# Patient Record
Sex: Female | Born: 1950 | Race: White | Hispanic: No | Smoking: Former smoker
Health system: Southern US, Community
[De-identification: ages and names within clinical notes are randomized; demographics above are authoritative.]

## PROBLEM LIST (undated history)

## (undated) DIAGNOSIS — O00109 Unspecified tubal pregnancy without intrauterine pregnancy: Secondary | ICD-10-CM

## (undated) DIAGNOSIS — F17200 Nicotine dependence, unspecified, uncomplicated: Secondary | ICD-10-CM

## (undated) DIAGNOSIS — K649 Unspecified hemorrhoids: Secondary | ICD-10-CM

## (undated) HISTORY — DX: Unspecified hemorrhoids: K64.9

## (undated) HISTORY — DX: Unspecified tubal pregnancy without intrauterine pregnancy: O00.109

## (undated) HISTORY — DX: Nicotine dependence, unspecified, uncomplicated: F17.200

## (undated) HISTORY — PX: TONSILLECTOMY: SUR1361

---

## 1975-04-09 DIAGNOSIS — O00109 Unspecified tubal pregnancy without intrauterine pregnancy: Secondary | ICD-10-CM

## 1975-04-09 HISTORY — DX: Unspecified tubal pregnancy without intrauterine pregnancy: O00.109

## 1975-04-09 HISTORY — PX: APPENDECTOMY: SHX54

## 1975-04-09 HISTORY — PX: ABDOMINAL HYSTERECTOMY: SHX81

## 2005-02-20 DIAGNOSIS — F17200 Nicotine dependence, unspecified, uncomplicated: Secondary | ICD-10-CM

## 2005-02-20 HISTORY — DX: Nicotine dependence, unspecified, uncomplicated: F17.200

## 2008-02-21 DIAGNOSIS — K219 Gastro-esophageal reflux disease without esophagitis: Secondary | ICD-10-CM | POA: Insufficient documentation

## 2009-05-02 ENCOUNTER — Ambulatory Visit: Payer: Self-pay | Admitting: Family Medicine

## 2009-05-10 LAB — HM DEXA SCAN: HM Dexa Scan: NORMAL

## 2014-07-11 LAB — LIPID PANEL
CHOLESTEROL: 273 mg/dL — AB (ref 0–200)
HDL: 46 mg/dL (ref 35–70)
LDL Cholesterol: 152 mg/dL
LDl/HDL Ratio: 3.3
TRIGLYCERIDES: 373 mg/dL — AB (ref 40–160)

## 2014-07-11 LAB — HEPATIC FUNCTION PANEL
ALT: 28 U/L (ref 7–35)
AST: 21 U/L (ref 13–35)
Alkaline Phosphatase: 65 U/L (ref 25–125)
Bilirubin, Total: 0.2 mg/dL

## 2014-07-11 LAB — CBC AND DIFFERENTIAL
HEMATOCRIT: 41 % (ref 36–46)
HEMOGLOBIN: 13.7 g/dL (ref 12.0–16.0)
NEUTROS ABS: 5 /uL
Platelets: 398 10*3/uL (ref 150–399)
WBC: 10.6 10^3/mL

## 2014-07-11 LAB — BASIC METABOLIC PANEL
BUN: 10 mg/dL (ref 4–21)
Creatinine: 1 mg/dL (ref 0.5–1.1)
Glucose: 97 mg/dL
POTASSIUM: 4.4 mmol/L (ref 3.4–5.3)
SODIUM: 140 mmol/L (ref 137–147)

## 2014-07-11 LAB — TSH: TSH: 1.63 u[IU]/mL (ref 0.41–5.90)

## 2014-07-13 DIAGNOSIS — N6310 Unspecified lump in the right breast, unspecified quadrant: Secondary | ICD-10-CM | POA: Insufficient documentation

## 2014-08-11 ENCOUNTER — Encounter: Payer: Self-pay | Admitting: General Surgery

## 2014-08-11 ENCOUNTER — Ambulatory Visit: Payer: BLUE CROSS/BLUE SHIELD

## 2014-08-11 ENCOUNTER — Ambulatory Visit (INDEPENDENT_AMBULATORY_CARE_PROVIDER_SITE_OTHER): Payer: BLUE CROSS/BLUE SHIELD | Admitting: General Surgery

## 2014-08-11 VITALS — BP 138/78 | HR 78 | Resp 13 | Ht 69.0 in | Wt 195.0 lb

## 2014-08-11 DIAGNOSIS — R928 Other abnormal and inconclusive findings on diagnostic imaging of breast: Secondary | ICD-10-CM | POA: Diagnosis not present

## 2014-08-11 DIAGNOSIS — N6310 Unspecified lump in the right breast, unspecified quadrant: Secondary | ICD-10-CM

## 2014-08-11 DIAGNOSIS — N63 Unspecified lump in breast: Secondary | ICD-10-CM | POA: Diagnosis not present

## 2014-08-11 NOTE — Progress Notes (Signed)
Patient ID: Alexis Hart, female   DOB: [birth year], 64 y.o.   MRN: [id number]  Chief Complaint  Patient presents with  . Breast Problem    abnormal mammogram    HPI Alexis Hart is a 64 y.o. female who presents for a breast evaluation. The most recent mammogram was done on 07-20-14 with added views on 08-02-14. Patient does perform regular self breast checks and gets regular mammograms done. She denies any problems with the breasts.  There is no history of trauma.  HPI  Past Medical History  Diagnosis Date  . Hyperlipidemia   . Hemorrhoids   . Tubal pregnancy 1977    Past Surgical History  Procedure Laterality Date  . Appendectomy  1977  . Abdominal hysterectomy  1977    Total    Family History  Problem Relation Age of Onset  . Ovarian cancer Mother   . Melanoma Father     Social History History  Substance Use Topics  . Smoking status: Current Every Day Smoker -- 1.25 packs/day for 40 years    Types: Cigarettes  . Smokeless tobacco: Never Used  . Alcohol Use: No    Allergies  Allergen Reactions  . Latex Rash    Current Outpatient Prescriptions  Medication Sig Dispense Refill  . nicotine polacrilex (NICORETTE) 4 MG gum Take 4 mg by mouth as needed for smoking cessation.    . pravastatin (PRAVACHOL) 20 MG tablet daily.  11  . Vitamin D, Ergocalciferol, (DRISDOL) 50000 UNITS CAPS capsule once a week.  2   No current facility-administered medications for this visit.    Review of Systems Review of Systems  Constitutional: Negative.   Respiratory: Negative.   Cardiovascular: Negative.     Blood pressure 138/78, pulse 78, resp. rate 13, height 5\' 9"  (1.753 m), weight 195 lb (88.451 kg).  Physical Exam Physical Exam  Constitutional: She is oriented to person, place, and time. She appears well-developed and well-nourished.  Eyes: Conjunctivae are normal. No scleral icterus.  Neck: Neck supple.  Cardiovascular: Normal rate, regular rhythm and normal  heart sounds.   Pulmonary/Chest: Effort normal and breath sounds normal. Right breast exhibits no inverted nipple, no mass, no nipple discharge, no skin change and no tenderness. Left breast exhibits no inverted nipple, no mass, no nipple discharge, no skin change and no tenderness. Breasts are asymmetrical (left breast 1/2 cup size larger).  Right breast little thickening in the upper outer quadrant  Lymphadenopathy:    She has no cervical adenopathy.    She has no axillary adenopathy.  Neurological: She is alert and oriented to person, place, and time.    Data Reviewed  PCP notes of 07/11/2014 were reviewed.  Laboratory studies associated with that visit showed a hemoglobin of 13.7 with an MCV of 85, white blood cell count of 10,600. Normal conference metabolic panel. Creatinine 0.9 with an estimated GFR of 63. Normal liver function studies.  Screening mammogram dated 07/20/2014 suggested an asymmetry/mass in the lateral aspect of the right breast for which additional views were requested. BI-RADS-0.  Focal spot compression views and ultrasound dated 08/02/2014 suggested a 1.1 cm oval mass in the right lateral breast 10 cm from the nipple. Ultrasound examination showed the area to be irregular and complex in appearance. Additional imaging post ultrasound suggested the ultrasound lesion in the mammographic abnormalities were one in the same. BI-RADS-4.  Ultrasound examination of the right breast showed multiple small cystic lesions in the retroareolar area. In the 8:00 position  of the breast 7 cm from the nipple an irregular multilobulated mass measuring 0.6 x 0.8 x 1.33 cm was noted. This corresponded to the imaging studies previously completed at [facility].  The patient was amenable to vacuum assisted biopsy. 10 mL of 0.5% Xylocaine with 0.25% Marcaine with 1-200,000 of epinephrine was utilized well tolerated. Under ultrasound guidance the 10-gauge Encor biopsy device was advanced into  the area and 7 core samples obtained with complete removal. A postbiopsy clip was placed. Of note there was a significant reduction in the volume of the area with initial passage of the cord biopsy needle.  The skin defect was closed with benzoin and Steri-Strips followed by Telfa and Tegaderm dressing.  Assessment    Abnormal right breast mammogram, likely complex cystic lesion rather than malignancy.    Plan    The patient tolerated the procedure well. Postbiopsy mammogram will be obtained tomorrow to confirm the area of mammographic concern has been sampled. Further follow-up will take place based on the pathology results.  Patient to have a post biopsy mammogram of the right breast at [facility] tomorrow, 08-12-14 at 11:50 am.   This patient will return to the office in one week for a nurse visit follow up.      PCP:  Erenest Rasher 08/12/2014, 12:24 PM

## 2014-08-11 NOTE — Patient Instructions (Addendum)
CARE AFTER BREAST BIOPSY  1. Leave the dressing on that your doctor applied after surgery. It is waterproof. You may bathe, shower and/or swim. The dressing will probably remain intact until your return office visit. If the dressing comes off, you will see small strips of tape against your skin on the incision. Do not remove these strips.  2. You may want to use a gauze,cloth or similar protection in your bra to prevent rubbing against your dressing and incision. This is not necessary, but you may feel more comfortable doing so.  3. It is recommended that you wear a bra day and night to give support to the breast. This will prevent the weight of the breast from pulling on the incision.  4. Your breast will feel hard and lumpy under the incision. Do not be alarmed. This is the underlying stitching of tissue. Softening of this tissue will occur in time.  5. Make sure you call the office and schedule an appointment in one week after your surgery. The office phone number is [phone]. The [facility] may have already done this for you.  6. You will notice about a week after your office visit that the strips of the tape on your incision will begin to loosen. These may then be removed.  7. Report to your doctor any of the following:  * Severe pain not relieved by your pain medication  *Redness of the incision  * Drainage from the incision  *Fever greater than 101 degrees  Patient to have a post biopsy mammogram of the right breast at [facility] tomorrow, 08-12-14 at 11:50 am.   This patient will return to the office in one week for a nurse visit follow up.

## 2014-08-12 DIAGNOSIS — R928 Other abnormal and inconclusive findings on diagnostic imaging of breast: Secondary | ICD-10-CM | POA: Insufficient documentation

## 2014-08-15 ENCOUNTER — Telehealth: Payer: Self-pay | Admitting: General Surgery

## 2014-08-15 NOTE — Telephone Encounter (Signed)
The patient was notified that the recently completed vacuum biopsy of the right breast was benign. This comes along with the marked reduction in the lesion size on passage of the 10-gauge needle through the area of concern.  We'll plan for nursing follow-up at the end of the week and repeat right breast mammogram and office ultrasound in 6 months.

## 2014-08-18 ENCOUNTER — Ambulatory Visit (INDEPENDENT_AMBULATORY_CARE_PROVIDER_SITE_OTHER): Payer: BLUE CROSS/BLUE SHIELD | Admitting: *Deleted

## 2014-08-18 DIAGNOSIS — N63 Unspecified lump in breast: Secondary | ICD-10-CM

## 2014-08-18 DIAGNOSIS — N6310 Unspecified lump in the right breast, unspecified quadrant: Secondary | ICD-10-CM

## 2014-08-18 NOTE — Progress Notes (Signed)
Patient here today for follow up post right breast biopsy. Minimal bruising noted.  The patient is aware that a heating pad may be used for comfort as needed.  Aware of pathology. Follow up as scheduled. 

## 2015-01-07 ENCOUNTER — Other Ambulatory Visit: Payer: Self-pay | Admitting: Family Medicine

## 2015-02-22 ENCOUNTER — Ambulatory Visit: Payer: BLUE CROSS/BLUE SHIELD | Admitting: General Surgery

## 2015-06-07 DIAGNOSIS — E559 Vitamin D deficiency, unspecified: Secondary | ICD-10-CM | POA: Insufficient documentation

## 2015-06-07 DIAGNOSIS — R413 Other amnesia: Secondary | ICD-10-CM | POA: Insufficient documentation

## 2015-06-07 DIAGNOSIS — Z8669 Personal history of other diseases of the nervous system and sense organs: Secondary | ICD-10-CM | POA: Insufficient documentation

## 2015-06-07 DIAGNOSIS — R030 Elevated blood-pressure reading, without diagnosis of hypertension: Secondary | ICD-10-CM | POA: Insufficient documentation

## 2015-07-13 ENCOUNTER — Encounter: Payer: Self-pay | Admitting: Family Medicine

## 2015-07-13 ENCOUNTER — Telehealth: Payer: Self-pay | Admitting: Family Medicine

## 2015-07-13 ENCOUNTER — Ambulatory Visit (INDEPENDENT_AMBULATORY_CARE_PROVIDER_SITE_OTHER): Payer: BLUE CROSS/BLUE SHIELD | Admitting: Family Medicine

## 2015-07-13 VITALS — BP 130/78 | HR 66 | Temp 98.0°F | Resp 16 | Ht 69.0 in | Wt 198.0 lb

## 2015-07-13 DIAGNOSIS — Z1211 Encounter for screening for malignant neoplasm of colon: Secondary | ICD-10-CM

## 2015-07-13 DIAGNOSIS — S82409A Unspecified fracture of shaft of unspecified fibula, initial encounter for closed fracture: Secondary | ICD-10-CM | POA: Insufficient documentation

## 2015-07-13 DIAGNOSIS — E785 Hyperlipidemia, unspecified: Secondary | ICD-10-CM

## 2015-07-13 DIAGNOSIS — S8261XA Displaced fracture of lateral malleolus of right fibula, initial encounter for closed fracture: Secondary | ICD-10-CM

## 2015-07-13 DIAGNOSIS — R030 Elevated blood-pressure reading, without diagnosis of hypertension: Secondary | ICD-10-CM

## 2015-07-13 DIAGNOSIS — E2839 Other primary ovarian failure: Secondary | ICD-10-CM

## 2015-07-13 DIAGNOSIS — N63 Unspecified lump in breast: Secondary | ICD-10-CM

## 2015-07-13 DIAGNOSIS — E559 Vitamin D deficiency, unspecified: Secondary | ICD-10-CM | POA: Diagnosis not present

## 2015-07-13 DIAGNOSIS — Z0000 Encounter for general adult medical examination without abnormal findings: Secondary | ICD-10-CM

## 2015-07-13 DIAGNOSIS — F410 Panic disorder [episodic paroxysmal anxiety] without agoraphobia: Secondary | ICD-10-CM | POA: Diagnosis not present

## 2015-07-13 DIAGNOSIS — N6310 Unspecified lump in the right breast, unspecified quadrant: Secondary | ICD-10-CM

## 2015-07-13 DIAGNOSIS — Z0001 Encounter for general adult medical examination with abnormal findings: Secondary | ICD-10-CM

## 2015-07-13 DIAGNOSIS — Z23 Encounter for immunization: Secondary | ICD-10-CM | POA: Diagnosis not present

## 2015-07-13 DIAGNOSIS — M8580 Other specified disorders of bone density and structure, unspecified site: Secondary | ICD-10-CM | POA: Diagnosis not present

## 2015-07-13 DIAGNOSIS — M545 Low back pain: Secondary | ICD-10-CM

## 2015-07-13 LAB — POCT URINALYSIS DIPSTICK
BILIRUBIN UA: NEGATIVE
GLUCOSE UA: NEGATIVE
Ketones, UA: NEGATIVE
Leukocytes, UA: NEGATIVE
NITRITE UA: NEGATIVE
Protein, UA: NEGATIVE
Spec Grav, UA: 1.01
Urobilinogen, UA: 0.2
pH, UA: 5

## 2015-07-13 NOTE — Patient Instructions (Signed)
   Please contact your eyecare professional to schedule a routine eye exam  

## 2015-07-13 NOTE — Telephone Encounter (Signed)
FYI--Pt refused bone density at this time.I advised pt that I could schedule it several months out if she would like.She states she will call back when ready to schedule

## 2015-07-13 NOTE — Telephone Encounter (Signed)
Fwd to Dr. Caryn Section: Juluis Rainier

## 2015-07-13 NOTE — Progress Notes (Signed)
Patient: Alexis Hart, Female    DOB: 11/29/1950, 65 y.o.   MRN: [id number] Visit Date: 07/13/2015  Today's Provider: Lelon Huh, MD   Chief Complaint  Patient presents with  . Medicare Wellness  . Follow-up    Vitamin D deficiency  . Hyperlipidemia    follow up  . Leg Injury   Subjective:    Annual wellness visit Alexis Hart is a 65 y.o. female. She feels fairly well. She reports never exercising. She reports she is sleeping well.  -----------------------------------------------------------  Lipid/Cholesterol, Follow-up:   Last seen for this1 years ago.  Management changes since that visit include advising patient to cut back on sweets and saturated fats. Patient was to continue taking Pravastatin, but has since stopped since did feel well when taking it. She states her energy has since improved, her hair in no longer dry and brittle, and no longer has dizzy spells since stopping statin.  . Last Lipid Panel:    Component Value Date/Time   CHOL 273* 07/11/2014   TRIG 373* 07/11/2014   HDL 46 07/11/2014   LDLCALC 152 07/11/2014    Risk factors for vascular disease include hypercholesterolemia  She reports poor compliance with treatment. Patient stopped taking Pravastatin 2 months ago due to patient believing that it was causing hair loss, fatigue, brittle nails and sensitivity to light. Patient states symptoms have all resolved since she stopped the Pravastatin. Patient has been cutting back on sweets and saturated fats.  She is having side effects.  Current symptoms include none and have been stable. Weight trend: fluctuating a bit Prior visit with dietician: no Current diet: in general, a "healthy" diet   Current exercise: none  Wt Readings from Last 3 Encounters:  07/11/14 193 lb (87.544 kg)  08/11/14 195 lb (88.451 kg)    ------------------------------------------------------------------- Follow up Vitamin D Deficiency: Last office visit  was 1 year ago and no changes were made. Patient was advised to continue weekly vitamin D supplements. Patient stopped taking Vitamin D supplements 2 months ago.  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 Patient was seen at [facility]. X rays were obtained showing an avulsion fracture of distal fibula Patient has swelling, pain and bruising of the right lower leg and foot. She is unable to drive because she is unable to apply pressure to break pedal. She is able to bear weight and walk, but is very painful. Pain and swelling have improved slightly over the last few days. She has been wearing any OTC ankle brace.   Mass right breast She was evaluated by Dr. Bary Castilla and had biopsy of mass of right breast about a year ago which was apparently benign. She advised to follow up with Dr. Bary Castilla after 6 months for in-office ultrasound, but she has not returned for follow up. She has not notived any new lesions or masses.   Review of Systems  Constitutional: Positive for fatigue. Negative for fever and chills.  HENT: Negative for congestion, ear pain, rhinorrhea, sneezing and sore throat.   Eyes: Negative.  Negative for pain and redness.  Respiratory: Positive for shortness of breath. Negative for cough and wheezing.   Cardiovascular: Positive for leg swelling. Negative for chest pain.  Gastrointestinal: Positive for diarrhea. Negative for nausea, abdominal pain, constipation and blood in stool.  Endocrine: Negative  for polydipsia and polyphagia.  Genitourinary: Negative.  Negative for dysuria, hematuria, flank pain, vaginal bleeding, vaginal discharge and pelvic pain.  Musculoskeletal: Positive for arthralgias. Negative for back pain, joint swelling and gait problem.  Skin: Negative for rash.  Neurological: Positive for dizziness and light-headedness. Negative for tremors, seizures,  weakness, numbness and headaches.  Hematological: Negative for adenopathy.  Psychiatric/Behavioral: Negative.  Negative for behavioral problems, confusion and dysphoric mood. The patient is not nervous/anxious and is not hyperactive.     Social History   Social History  . Marital Status: Divorced    Spouse Name: N/A  . Number of Children: N/A  . Years of Education: N/A   Occupational History  . Not on file.   Social History Main Topics  . Smoking status: Current Every Day Smoker -- 1.25 packs/day for 40 years    Types: Cigarettes  . Smokeless tobacco: Never Used  . Alcohol Use: No  . Drug Use: No  . Sexual Activity: Not on file   Other Topics Concern  . Not on file   Social History Narrative    Past Medical History  Diagnosis Date  . Hyperlipidemia   . Hemorrhoids   . Tubal pregnancy 1977     Patient Active Problem List   Diagnosis Date Noted  . Blood pressure elevated without history of HTN 06/07/2015  . H/O disease 06/07/2015  . Memory loss 06/07/2015  . Avitaminosis D 06/07/2015  . Abnormal mammogram of right breast 08/12/2014  . Osteopenia 05/10/2009  . Acid reflux 02/21/2008  . Episodic paroxysmal anxiety disorder 02/18/2007  . Compulsive tobacco user syndrome 02/20/2005  . Combined fat and carbohydrate induced hyperlipemia 04/08/2002    Past Surgical History  Procedure Laterality Date  . Appendectomy  1977  . Abdominal hysterectomy  1977    Total-due to ectopic pregnancy    Her family history includes Diabetes in her father; Heart attack in her paternal grandmother; Heart disease in her father; Lupus in her paternal grandfather and sister; Melanoma in her father; Ovarian cancer in her mother.    Previous Medications   NICOTINE POLACRILEX (NICORETTE) 4 MG GUM    Take 4 mg by mouth as needed for smoking cessation.   PRAVASTATIN (PRAVACHOL) 20 MG TABLET    daily.   VITAMIN D, ERGOCALCIFEROL, (DRISDOL) 50000 UNITS CAPS CAPSULE    TAKE 1 CAPSULE BY  MOUTH EVERY WEEK FOR 12 WEEKS.    Patient Care Team: Birdie Sons, MD as PCP - General (Family Medicine) Robert Bellow, MD as Consulting Physician (General Surgery) Birdie Sons, MD as Referring Physician (Family Medicine)     Objective:   Vitals: BP 130/78 mmHg  Pulse 66  Temp(Src) 98 F (36.7 C) (Oral)  Resp 16  Ht 5\' 9"  (1.753 m)  Wt 198 lb (89.812 kg)  BMI 29.23 kg/m2  SpO2 97%  Physical Exam   General Appearance:    Alert, cooperative, no distress, appears stated age  Head:    Normocephalic, without obvious abnormality, atraumatic  Eyes:    PERRL, conjunctiva/corneas clear, EOM's intact, fundi    benign, both eyes  Ears:    Normal TM's and external ear canals, both ears  Nose:   Nares normal, septum midline, mucosa normal, no drainage    or sinus tenderness  Throat:   Lips, mucosa, and tongue normal; teeth and gums normal  Neck:   Supple, symmetrical, trachea midline, no adenopathy;    thyroid:  no enlargement/tenderness/nodules; no carotid  bruit or JVD  Back:     Symmetric, no curvature, ROM normal, no CVA tenderness  Lungs:     Clear to auscultation bilaterally, respirations unlabored  Chest Wall:    No tenderness or deformity   Heart:    Regular rate and rhythm, S1 and S2 normal, no murmur, rub   or gallop  Breast Exam:    deferred  Abdomen:     Soft, non-tender, bowel sounds active all four quadrants,    no masses, no organomegaly  Pelvic:    deferred  Extremities:   Moderately swollen and tender right ankle. Bears weight just enough to ambulate, but with significant pain.   Pulses:   2+ and symmetric all extremities  Skin:   Skin color, texture, turgor normal, no rashes or lesions  Lymph nodes:   Cervical, supraclavicular, and axillary nodes normal  Neurologic:   CNII-XII intact, normal strength, sensation and reflexes    throughout     Activities of Daily Living In your present state of health, do you have any difficulty performing the  following activities: 07/13/2015  Hearing? N  Vision? N  Difficulty concentrating or making decisions? N  Walking or climbing stairs? Y  Dressing or bathing? Y  Doing errands, shopping? Y    Fall Risk Assessment Fall Risk  07/13/2015  Falls in the past year? Yes  Number falls in past yr: 1  Injury with Fall? Yes  Follow up Falls prevention discussed     Depression Screen PHQ 2/9 Scores 07/13/2015  PHQ - 2 Score 0   Current Exercise Habits: The patient does not participate in regular exercise at present Exercise limited by: Other - see comments (recent fracture)  Cognitive Testing - 6-CIT  Correct? Score   What year is it? yes 0 0 or 4  What month is it? yes 0 0 or 3  Memorize:    Pia Mau,  40,  [place],      What time is it? (within 1 hour) yes 0 0 or 3  Count backwards from 20 yes 0 0, 2, or 4  Name the months of the year no 2 0, 2, or 4  Repeat name & address above no 10 0, 2, 4, 6, 8, or 10       TOTAL SCORE  12/28   Interpretation:  Abnormal- .  Normal (0-7) Abnormal (8-28)    Audit-C Alcohol Use Screening  Question Answer Points  How often do you have alcoholic drink? never 0  On days you do drink alcohol, how many drinks do you typically consume? 0 0  How oftey will you drink 6 or more in a total? never 0  Total Score:  0   A score of 3 or more in women, and 4 or more in men indicates increased risk for alcohol abuse, EXCEPT if all of the points are from question 1.    Assessment & Plan:     Annual Wellness Visit  Reviewed patient's Family Medical History Reviewed and updated list of patient's medical providers Assessment of cognitive impairment was done Assessed patient's functional ability Established a written schedule for health screening Gervais Completed and Reviewed  Exercise Activities and Dietary recommendations Goals    None      Immunization History  Administered Date(s) Administered  .  Influenza-Unspecified 01/24/2015  . Pneumococcal Conjugate-13 06/28/2013  . Tdap 05/02/2009  . Zoster 05/02/2009    Health Maintenance  Topic Date  Due  . Hepatitis C Screening  11-15-1950  . COLONOSCOPY  06/27/1998  . PNA vac Low Risk Adult (2 of 2 - PPSV23) 06/29/2014  . INFLUENZA VACCINE  11/07/2015  . MAMMOGRAM  08/10/2016  . TETANUS/TDAP  05/03/2019  . DEXA SCAN  Completed  . ZOSTAVAX  Completed      Discussed health benefits of physical activity, and encouraged her to engage in regular exercise appropriate for her age and condition.    ------------------------------------------------------------------------------------------------------------  1. Medicare annual wellness visit, subsequent   2. Hyperlipidemia Intolerant to multiple statins - Lipid panel - Hepatic function panel  3. Osteopenia   4. Vitamin D deficiency Feels better on vitamin D - VITAMIN D 25 Hydroxy (Vit-D Deficiency, Fractures)  5. Panic disorder   6. Mass of breast, right Overdue for follow up with Dr. Bary Castilla - Ambulatory referral to General Surgery  7. Blood pressure elevated without history of HTN Better today - Renal function panel  8. Estrogen deficiency  - DG Bone Density; Future  9. Avulsion fracture right, closed, initial encounter  - DG Bone Density; Future - Refer orthopedics. Is not to drive until cleared by orthopedics. Work excuse provided today.   10. Need for pneumococcal vaccination  - Pneumococcal polysaccharide vaccine 23-valent greater than or equal to 2yo subcutaneous/IM  11. Colon cancer screening Cologuard ordered

## 2015-07-14 ENCOUNTER — Telehealth: Payer: Self-pay | Admitting: Family Medicine

## 2015-07-14 LAB — RENAL FUNCTION PANEL
Albumin: 4.8 g/dL (ref 3.6–4.8)
BUN / CREAT RATIO: 15 (ref 12–28)
BUN: 14 mg/dL (ref 8–27)
CALCIUM: 10.3 mg/dL (ref 8.7–10.3)
CHLORIDE: 98 mmol/L (ref 96–106)
CO2: 24 mmol/L (ref 18–29)
Creatinine, Ser: 0.92 mg/dL (ref 0.57–1.00)
GFR calc non Af Amer: 65 mL/min/{1.73_m2} (ref >59)
GFR, EST AFRICAN AMERICAN: 75 mL/min/{1.73_m2} (ref >59)
Glucose: 95 mg/dL (ref 65–99)
POTASSIUM: 4.7 mmol/L (ref 3.5–5.2)
Phosphorus: 4.2 mg/dL (ref 2.5–4.5)
Sodium: 140 mmol/L (ref 134–144)

## 2015-07-14 LAB — HEPATIC FUNCTION PANEL
ALT: 22 IU/L (ref 0–32)
AST: 21 IU/L (ref 0–40)
Alkaline Phosphatase: 66 IU/L (ref 39–117)
Bilirubin Total: 0.3 mg/dL (ref 0.0–1.2)
Bilirubin, Direct: 0.08 mg/dL (ref 0.00–0.40)
Total Protein: 7.8 g/dL (ref 6.0–8.5)

## 2015-07-14 LAB — LIPID PANEL
CHOL/HDL RATIO: 6.5 ratio — AB (ref 0.0–4.4)
Cholesterol, Total: 267 mg/dL — ABNORMAL HIGH (ref 100–199)
HDL: 41 mg/dL (ref >39)
LDL CALC: 178 mg/dL — AB (ref 0–99)
TRIGLYCERIDES: 241 mg/dL — AB (ref 0–149)
VLDL CHOLESTEROL CAL: 48 mg/dL — AB (ref 5–40)

## 2015-07-14 LAB — VITAMIN D 25 HYDROXY (VIT D DEFICIENCY, FRACTURES): Vit D, 25-Hydroxy: 19.9 ng/mL — ABNORMAL LOW (ref 30.0–100.0)

## 2015-07-14 NOTE — Telephone Encounter (Signed)
Order for coloquard faxed to [facility].

## 2015-07-19 ENCOUNTER — Telehealth: Payer: Self-pay

## 2015-07-19 MED ORDER — EZETIMIBE 10 MG PO TABS: 10.0000 mg | ORAL_TABLET | Freq: Every day | ORAL | Status: DC

## 2015-07-19 NOTE — Telephone Encounter (Signed)
Patient advised as directed below. Patient is willing to try Zetia. RX sent to pharmacy.   Patient is requesting a refill on Vitamin D be sent to [facility] in [facility]. Last refill 01/07/2015 with 3 add'l refills. Contacted [facility] and patient has remaining refills. Pharmacist will fill RX.

## 2015-07-19 NOTE — Telephone Encounter (Signed)
-----   Message from Birdie Sons, MD sent at 07/19/2015  8:05 AM EDT ----- Cholesterol is very high at 267. Recommend she try Zetia, which is not a statin. 10mg  daily, #30, rf x 5. Vitamin d level is slightly low, be sure to take vitamin d supplement every week. Follow up for cholesterol in 3 months. Call if any problems with Zetia

## 2015-07-22 LAB — COLOGUARD: Cologuard: NEGATIVE

## 2015-07-28 LAB — HM DEXA SCAN: HM DEXA SCAN: BORDERLINE

## 2015-08-01 ENCOUNTER — Other Ambulatory Visit: Payer: Self-pay | Admitting: *Deleted

## 2015-08-01 DIAGNOSIS — Z1211 Encounter for screening for malignant neoplasm of colon: Secondary | ICD-10-CM

## 2015-08-02 NOTE — Progress Notes (Signed)
Patient advised as directed below. 

## 2015-08-04 ENCOUNTER — Telehealth: Payer: Self-pay | Admitting: *Deleted

## 2015-08-04 ENCOUNTER — Encounter: Payer: Self-pay | Admitting: *Deleted

## 2015-08-04 NOTE — Telephone Encounter (Signed)
Called pt with Bone Density results. LMOVM for pt to return call.

## 2015-08-08 NOTE — Telephone Encounter (Signed)
Patient was notified.

## 2017-08-20 ENCOUNTER — Ambulatory Visit: Payer: Medicare Other | Admitting: Family Medicine

## 2017-08-28 ENCOUNTER — Telehealth: Payer: Self-pay

## 2017-08-28 NOTE — Telephone Encounter (Signed)
LM for pt to CB and schedule AWV prior to CPE on 09/15/17. -MM

## 2017-09-04 NOTE — Telephone Encounter (Signed)
Called pt to schedule AWV prior to CPE on 09/15/17. Pt states she thought when she scheduled that apt that is was for the AWV. Pt stated that she wanted to cancel the CPE and only scheduled the AWV and will schedule the CPE after completing the AWV. Scheduled AWV for 09/22/17 @ 240 and cancelled CPE on 09/15/17. -MM

## 2017-09-15 ENCOUNTER — Encounter: Payer: Medicare Other | Admitting: Family Medicine

## 2017-09-22 ENCOUNTER — Ambulatory Visit (INDEPENDENT_AMBULATORY_CARE_PROVIDER_SITE_OTHER): Payer: PPO

## 2017-09-22 VITALS — BP 140/82 | HR 80 | Temp 99.6°F | Ht 69.0 in | Wt 202.4 lb

## 2017-09-22 DIAGNOSIS — Z13820 Encounter for screening for osteoporosis: Secondary | ICD-10-CM | POA: Diagnosis not present

## 2017-09-22 DIAGNOSIS — Z0000 Encounter for general adult medical examination without abnormal findings: Secondary | ICD-10-CM

## 2017-09-22 NOTE — Progress Notes (Signed)
Subjective:   Alexis Hart is a 67 y.o. female who presents for Medicare Annual (Subsequent) preventive examination.  Review of Systems:  N/A  Cardiac Risk Factors include: advanced age (>70men, >71 women);dyslipidemia     Objective:     Vitals: BP 140/82 (BP Location: Right Arm)   Pulse 80   Temp 99.6 F (37.6 C) (Oral)   Ht 5\' 9"  (1.753 m)   Wt 202 lb 6.4 oz (91.8 kg)   BMI 29.89 kg/m   Body mass index is 29.89 kg/m.  Advanced Directives 09/22/2017  Does Patient Have a Medical Advance Directive? No  Would patient like information on creating a medical advance directive? Yes (MAU/Ambulatory/Procedural Areas - Information given)    Tobacco Social History   Tobacco Use  Smoking Status Former Smoker  . Years: 40.00  . Types: Cigarettes  . Start date: 06/29/2015  . Last attempt to quit: 08/20/2017  . Years since quitting: 0.0  Smokeless Tobacco Never Used     Counseling given: Not Answered   Clinical Intake:  Pre-visit preparation completed: Yes  Pain : No/denies pain Pain Score: 0-No pain     Nutritional Status: BMI 25 -29 Overweight Nutritional Risks: None Diabetes: No  How often do you need to have someone help you when you read instructions, pamphlets, or other written materials from your doctor or pharmacy?: 2 - Rarely  Interpreter Needed?: No  Information entered by :: [facility], LPN  Past Medical History:  Diagnosis Date  . Hemorrhoids   . Hyperlipidemia   . Tubal pregnancy 1977   Past Surgical History:  Procedure Laterality Date  . ABDOMINAL HYSTERECTOMY  1977   Total-due to ectopic pregnancy  . APPENDECTOMY  1977   Family History  Problem Relation Age of Onset  . Ovarian cancer Mother   . Melanoma Father   . Diabetes Father   . Heart disease Father   . Lupus Sister   . Lupus Paternal Grandfather   . Heart attack Paternal Grandmother    Social History   Socioeconomic History  . Marital status: Divorced    Spouse name:  Not on file  . Number of children: 2  . Years of education: Not on file  . Highest education level: 12th grade  Occupational History  . 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 Financial resource strain: Not hard at all  . Food insecurity:    Worry: Never true    Inability: Never true  . Transportation needs:    Medical: No    Non-medical: No  Tobacco Use  . Smoking status: Former Smoker    Years: 40.00    Types: Cigarettes    Start date: 06/29/2015    Last attempt to quit: 08/20/2017    Years since quitting: 0.0  . Smokeless tobacco: Never Used  Substance and Sexual Activity  . Alcohol use: No    Alcohol/week: 0.0 oz  . Drug use: No  . Sexual activity: Not on file  Lifestyle  . Physical activity:    Days per week: Not on file    Minutes per session: Not on file  . Stress: Not at all  Relationships  . Social connections:    Talks on phone: Not on file    Gets together: Not on file    Attends religious service: Not on file    Active member of club or organization: Not on file    Attends meetings of clubs or organizations: Not  on file    Relationship status: Not on file  Other Topics Concern  . Not on file  Social History Narrative  . Not on file    Outpatient Encounter Medications as of 09/22/2017  Medication Sig  . diphenhydramine-acetaminophen (TYLENOL PM) 25-500 MG TABS tablet Take 1 tablet by mouth at bedtime as needed.  . Influenza Vac Types A & B PF 0.5 ML SUSY Fluvirin 2016-17 (PF) 45 mcg (15 mcg x 3)/0.5 mL intramuscular syringe  inject 0.5 milliliter intramuscularly  . Probiotic Product (PROBIOTIC DAILY PO) Take by mouth daily.  Marland Kitchen pyridOXINE (VITAMIN B-6) 50 MG tablet Take 50 mg by mouth daily.  Marland Kitchen ezetimibe (ZETIA) 10 MG tablet Take 1 tablet (10 mg total) by mouth daily. (Patient not taking: Reported on 09/22/2017)  . nicotine polacrilex (NICORETTE) 4 MG gum Take 4 mg by mouth as needed for smoking cessation.  . Vitamin D, Ergocalciferol,  (DRISDOL) 50000 UNITS CAPS capsule TAKE 1 CAPSULE BY MOUTH EVERY WEEK FOR 12 WEEKS. (Patient not taking: Reported on 09/22/2017)   No facility-administered encounter medications on file as of 09/22/2017.     Activities of Daily Living In your present state of health, do you have any difficulty performing the following activities: 09/22/2017  Hearing? N  Vision? N  Difficulty concentrating or making decisions? Y  Walking or climbing stairs? Y  Comment Due to SOB.  Dressing or bathing? N  Doing errands, shopping? N  Preparing Food and eating ? N  Using the Toilet? N  In the past six months, have you accidently leaked urine? Y  Comment Occasionally, does not have to wear protection.   Do you have problems with loss of bowel control? N  Managing your Medications? N  Managing your Finances? N  Housekeeping or managing your Housekeeping? N  Some recent data might be hidden    Patient Care Team: Birdie Sons, MD as PCP - General (Family Medicine) Bary Castilla Forest Gleason, MD as Consulting Physician (General Surgery)    Assessment:   This is a routine wellness examination for Alexis Hart.  Exercise Activities and Dietary recommendations Current Exercise Habits: The patient does not participate in regular exercise at present, Exercise limited by: None identified  Goals    None      Fall Risk Fall Risk  09/22/2017 07/13/2015  Falls in the past year? No Yes  Number falls in past yr: - 1  Injury with Fall? - Yes  Follow up - Falls prevention discussed   Is the patient's home free of loose throw rugs in walkways, pet beds, electrical cords, etc?   yes      Grab bars in the bathroom? yes      Handrails on the stairs?   yes      Adequate lighting?   yes  Timed Get Up and Go performed: N/A  Depression Screen PHQ 2/9 Scores 09/22/2017 07/13/2015  PHQ - 2 Score 4 0  PHQ- 9 Score 14 -     Cognitive Function: Pt declined screening today.         Immunization History  Administered  Date(s) Administered  . Influenza-Unspecified 01/24/2015  . Pneumococcal Conjugate-13 06/28/2013  . Pneumococcal Polysaccharide-23 07/13/2015  . Tdap 05/02/2009  . Zoster 05/02/2009    Qualifies for Shingles Vaccine? Due for Shingles vaccine. Declined my offer to administer today. Education has been provided regarding the importance of this vaccine. Pt has been advised to call her insurance company to determine her out of pocket expense.  Advised she may also receive this vaccine at her local pharmacy or Health Dept. Verbalized acceptance and understanding.  Screening Tests Health Maintenance  Topic Date Due  . Hepatitis C Screening  1950/05/31  . MAMMOGRAM  08/10/2016  . DEXA SCAN  07/27/2017  . INFLUENZA VACCINE  11/06/2017  . COLONOSCOPY  07/26/2018  . TETANUS/TDAP  05/03/2019  . PNA vac Low Risk Adult  Completed    Cancer Screenings: Lung: Low Dose CT Chest recommended if Age 11-80 years, 30 pack-year currently smoking OR have quit w/in 15years. Patient does qualify, however declines referral.  Breast:  Up to date on Mammogram? No, pt declines order today. Up to date of Bone Density/Dexa? No, referral sent today.  Colorectal: Up to date  Additional Screenings:  Hepatitis C Screening: Pt declines today but would like this added on to yearly BW orders.     Plan:  I have personally reviewed and addressed the Medicare Annual Wellness questionnaire and have noted the following in the patient's chart:  A. Medical and social history B. Use of alcohol, tobacco or illicit drugs  C. Current medications and supplements D. Functional ability and status E.  Nutritional status F.  Physical activity G. Advance directives H. List of other physicians I.  Hospitalizations, surgeries, and ER visits in previous 12 months J.  [place] such as hearing and vision if needed, cognitive and depression L. Referrals and appointments - none  In addition, I have reviewed and discussed  with patient certain preventive protocols, quality metrics, and best practice recommendations. A written personalized care plan for preventive services as well as general preventive health recommendations were provided to patient.  See attached scanned questionnaire for additional information.   Signed,  Fabio Neighbors, LPN Nurse Health Advisor   Nurse Recommendations: Referral sent for a DEXA scan. Pt would like the Hep C lab to be added to her yearly BW orders. Pt declined setting up a mammogram.

## 2017-09-22 NOTE — Patient Instructions (Signed)
Alexis Hart , Thank you for taking time to come for your Medicare Wellness Visit. I appreciate your ongoing commitment to your health goals. Please review the following plan we discussed and let me know if I can assist you in the future.   Screening recommendations/referrals: Colonoscopy: Up to date Mammogram: Pt declines today.  Bone Density: Referral sent today.  Recommended yearly ophthalmology/optometry visit for glaucoma screening and checkup Recommended yearly dental visit for hygiene and checkup  Vaccinations: Influenza vaccine: N/A Pneumococcal vaccine: Up to date Tdap vaccine: Pt declines today.  Shingles vaccine: Pt declines today.     Advanced directives: Advance directive discussed with you today. I have provided a copy for you to complete at home and have notarized. Once this is complete please bring a copy in to our office so we can scan it into your chart.  Conditions/risks identified: Obesity- Recommend to eat 3 small meals a days with 2 healthy snack in between. Avoid snacking on high sugar foods.   Next appointment: 10/22/17 @ 10 with Dr Caryn Section. Declined scheduling the AWV for 2020.    Preventive Care 69 Years and Older, Female Preventive care refers to lifestyle choices and visits with your health care provider that can promote health and wellness. What does preventive care include?  A yearly physical exam. This is also called an annual well check.  Dental exams once or twice a year.  Routine eye exams. Ask your health care provider how often you should have your eyes checked.  Personal lifestyle choices, including:  Daily care of your teeth and gums.  Regular physical activity.  Eating a healthy diet.  Avoiding tobacco and drug use.  Limiting alcohol use.  Practicing safe sex.  Taking low-dose aspirin every day.  Taking vitamin and mineral supplements as recommended by your health care provider. What happens during an annual well check? The  services and screenings done by your health care provider during your annual well check will depend on your age, overall health, lifestyle risk factors, and family history of disease. Counseling  Your health care provider may ask you questions about your:  Alcohol use.  Tobacco use.  Drug use.  Emotional well-being.  Home and relationship well-being.  Sexual activity.  Eating habits.  History of falls.  Memory and ability to understand (cognition).  Work and work Statistician.  Reproductive health. Screening  You may have the following tests or measurements:  Height, weight, and BMI.  Blood pressure.  Lipid and cholesterol levels. These may be checked every 5 years, or more frequently if you are over 69 years old.  Skin check.  Lung cancer screening. You may have this screening every year starting at age 69 if you have a 30-pack-year history of smoking and currently smoke or have quit within the past 15 years.  Fecal occult blood test (FOBT) of the stool. You may have this test every year starting at age 69.  Flexible sigmoidoscopy or colonoscopy. You may have a sigmoidoscopy every 5 years or a colonoscopy every 10 years starting at age 69.  Hepatitis C blood test.  Hepatitis B blood test.  Sexually transmitted disease (STD) testing.  Diabetes screening. This is done by checking your blood sugar (glucose) after you have not eaten for a while (fasting). You may have this done every 1-3 years.  Bone density scan. This is done to screen for osteoporosis. You may have this done starting at age 69.  Mammogram. This may be done every 1-2 years. Talk  to your health care provider about how often you should have regular mammograms. Talk with your health care provider about your test results, treatment options, and if necessary, the need for more tests. Vaccines  Your health care provider may recommend certain vaccines, such as:  Influenza vaccine. This is recommended  every year.  Tetanus, diphtheria, and acellular pertussis (Tdap, Td) vaccine. You may need a Td booster every 10 years.  Zoster vaccine. You may need this after age 69.  Pneumococcal 13-valent conjugate (PCV13) vaccine. One dose is recommended after age 69.  Pneumococcal polysaccharide (PPSV23) vaccine. One dose is recommended after age 69. Talk to your health care provider about which screenings and vaccines you need and how often you need them. This information is not intended to replace advice given to you by your health care provider. Make sure you discuss any questions you have with your health care provider. Document Released: 04/21/2015 Document Revised: 12/13/2015 Document Reviewed: 01/24/2015 Elsevier Interactive Patient Education  2017 [place] Prevention in the Home Falls can cause injuries. They can happen to people of all ages. There are many things you can do to make your home safe and to help prevent falls. What can I do on the outside of my home?  Regularly fix the edges of walkways and driveways and fix any cracks.  Remove anything that might make you trip as you walk through a door, such as a raised step or threshold.  Trim any bushes or trees on the path to your home.  Use bright outdoor lighting.  Clear any walking paths of anything that might make someone trip, such as rocks or tools.  Regularly check to see if handrails are loose or broken. Make sure that both sides of any steps have handrails.  Any raised decks and porches should have guardrails on the edges.  Have any leaves, snow, or ice cleared regularly.  Use sand or salt on walking paths during winter.  Clean up any spills in your garage right away. This includes oil or grease spills. What can I do in the bathroom?  Use night lights.  Install grab bars by the toilet and in the tub and shower. Do not use towel bars as grab bars.  Use non-skid mats or decals in the tub or shower.  If  you need to sit down in the shower, use a plastic, non-slip stool.  Keep the floor dry. Clean up any water that spills on the floor as soon as it happens.  Remove soap buildup in the tub or shower regularly.  Attach bath mats securely with double-sided non-slip rug tape.  Do not have throw rugs and other things on the floor that can make you trip. What can I do in the bedroom?  Use night lights.  Make sure that you have a light by your bed that is easy to reach.  Do not use any sheets or blankets that are too big for your bed. They should not hang down onto the floor.  Have a firm chair that has side arms. You can use this for support while you get dressed.  Do not have throw rugs and other things on the floor that can make you trip. What can I do in the kitchen?  Clean up any spills right away.  Avoid walking on wet floors.  Keep items that you use a lot in easy-to-reach places.  If you need to reach something above you, use a strong step stool that  has a grab bar.  Keep electrical cords out of the way.  Do not use floor polish or wax that makes floors slippery. If you must use wax, use non-skid floor wax.  Do not have throw rugs and other things on the floor that can make you trip. What can I do with my stairs?  Do not leave any items on the stairs.  Make sure that there are handrails on both sides of the stairs and use them. Fix handrails that are broken or loose. Make sure that handrails are as long as the stairways.  Check any carpeting to make sure that it is firmly attached to the stairs. Fix any carpet that is loose or worn.  Avoid having throw rugs at the top or bottom of the stairs. If you do have throw rugs, attach them to the floor with carpet tape.  Make sure that you have a light switch at the top of the stairs and the bottom of the stairs. If you do not have them, ask someone to add them for you. What else can I do to help prevent falls?  Wear shoes  that:  Do not have high heels.  Have rubber bottoms.  Are comfortable and fit you well.  Are closed at the toe. Do not wear sandals.  If you use a stepladder:  Make sure that it is fully opened. Do not climb a closed stepladder.  Make sure that both sides of the stepladder are locked into place.  Ask someone to hold it for you, if possible.  Clearly mark and make sure that you can see:  Any grab bars or handrails.  First and last steps.  Where the edge of each step is.  Use tools that help you move around (mobility aids) if they are needed. These include:  Canes.  Walkers.  Scooters.  Crutches.  Turn on the lights when you go into a dark area. Replace any light bulbs as soon as they burn out.  Set up your furniture so you have a clear path. Avoid moving your furniture around.  If any of your floors are uneven, fix them.  If there are any pets around you, be aware of where they are.  Review your medicines with your doctor. Some medicines can make you feel dizzy. This can increase your chance of falling. Ask your doctor what other things that you can do to help prevent falls. This information is not intended to replace advice given to you by your health care provider. Make sure you discuss any questions you have with your health care provider. Document Released: 01/19/2009 Document Revised: 08/31/2015 Document Reviewed: 04/29/2014 Elsevier Interactive Patient Education  2017 [employer].

## 2017-10-10 ENCOUNTER — Telehealth: Payer: Self-pay | Admitting: Family Medicine

## 2017-10-10 NOTE — Telephone Encounter (Signed)
Appointment scheduled at [facility] for 10/16/17 at 11:00.Pt advised

## 2017-10-10 NOTE — Telephone Encounter (Signed)
Per Jackelyn Poling at [place] they will need another diagnosis for bone density other than screening for osteoporosis due to pt having Medicare

## 2017-10-10 NOTE — Telephone Encounter (Signed)
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

## 2017-10-10 NOTE — Telephone Encounter (Signed)
Please review. Thanks!  

## 2017-10-21 DIAGNOSIS — M85852 Other specified disorders of bone density and structure, left thigh: Secondary | ICD-10-CM | POA: Diagnosis not present

## 2017-10-21 DIAGNOSIS — E2839 Other primary ovarian failure: Secondary | ICD-10-CM | POA: Diagnosis not present

## 2017-10-21 DIAGNOSIS — Z780 Asymptomatic menopausal state: Secondary | ICD-10-CM | POA: Diagnosis not present

## 2017-10-22 ENCOUNTER — Encounter: Payer: Self-pay | Admitting: Family Medicine

## 2017-10-22 ENCOUNTER — Ambulatory Visit (INDEPENDENT_AMBULATORY_CARE_PROVIDER_SITE_OTHER): Payer: PPO | Admitting: Family Medicine

## 2017-10-22 VITALS — BP 120/80 | HR 68 | Temp 98.1°F | Resp 16 | Ht 69.0 in | Wt 194.0 lb

## 2017-10-22 DIAGNOSIS — L723 Sebaceous cyst: Secondary | ICD-10-CM | POA: Diagnosis not present

## 2017-10-22 DIAGNOSIS — Z0000 Encounter for general adult medical examination without abnormal findings: Secondary | ICD-10-CM

## 2017-10-22 DIAGNOSIS — M8580 Other specified disorders of bone density and structure, unspecified site: Secondary | ICD-10-CM | POA: Diagnosis not present

## 2017-10-22 DIAGNOSIS — F17200 Nicotine dependence, unspecified, uncomplicated: Secondary | ICD-10-CM | POA: Diagnosis not present

## 2017-10-22 DIAGNOSIS — E559 Vitamin D deficiency, unspecified: Secondary | ICD-10-CM | POA: Diagnosis not present

## 2017-10-22 DIAGNOSIS — Z87891 Personal history of nicotine dependence: Secondary | ICD-10-CM | POA: Insufficient documentation

## 2017-10-22 DIAGNOSIS — E785 Hyperlipidemia, unspecified: Secondary | ICD-10-CM

## 2017-10-22 DIAGNOSIS — Z0001 Encounter for general adult medical examination with abnormal findings: Secondary | ICD-10-CM

## 2017-10-22 DIAGNOSIS — Z1159 Encounter for screening for other viral diseases: Secondary | ICD-10-CM | POA: Diagnosis not present

## 2017-10-22 NOTE — Patient Instructions (Addendum)
   All forms of nicotine cause blood vessels in your heart and brain to constrict. Although nicotine gum is not as dangerous as smoking cigarettes, it is best to wean off of all forms of nicotine   Please contact your eyecare professional to schedule a routine eye exam   The CDC recommends two doses of Shingrix (the shingles vaccine) separated by 2 to 6 months for adults age 69 years and older. I recommend checking with your insurance or pharmacy plan regarding coverage for this vaccine.    Zetia (ezetimibe) is not a statin, but it does lower cholesterol. Most people who cannot take statins tolerate Zetia just fine   Please call the [facility] [phone]) to schedule a routine screening mammogram.

## 2017-10-22 NOTE — Progress Notes (Addendum)
Patient: Alexis Hart, Female    DOB: [birth year], 67 y.o.   MRN: [id number] Visit Date: 10/22/2017  Today's Provider: Lelon Huh, MD   Chief Complaint  Patient presents with  . Annual Exam   Subjective:   Patient saw [facility] for AWV on 09/22/2017.    Complete Physical [name] is a 67 y.o. female. She feels fairly well. She reports exercising none, but has recently joined a fitness center. . She reports she is sleeping fairly well.  -----------------------------------------------------------   Lipid/Cholesterol, Follow-up:   Last seen for this 07/13/2015.  Management since that visit includes; labs checked. Prescribed Zetia 10 mg qd which she never started. She had adverse reactions to multiple statins and thought she would feel better off of all medications.   Last Lipid Panel:    Component Value Date/Time   CHOL 267 (H) 07/13/2015 1209   TRIG 241 (H) 07/13/2015 1209   HDL 41 07/13/2015 1209   CHOLHDL 6.5 (H) 07/13/2015 1209   LDLCALC 178 (H) 07/13/2015 1209    Wt Readings from Last 3 Encounters:  10/22/17 194 lb (88 kg)  09/22/17 202 lb 6.4 oz (91.8 kg)  07/13/15 198 lb (89.8 kg)    ---------------------------------------------------------------  Osteopenia From 07/13/2015. Had follow up BMd done yesterday at [facility] and has not been read yet.   Vitamin D deficiency From 07/13/2015-labs checked. Counseled to be sure to take vitamin D supplement every week. Lab Results  Component Value Date   VD25OH 19.9 (L) 07/13/2015   She states she only takes vitamin D supplement every once in awhile  Panic disorder From 07/13/2015. States she gets upset easily, but is not interfering with ADLs and doesn't feel like she needs medication.     Review of Systems  Constitutional: Positive for fatigue.  HENT: Positive for tinnitus.   Eyes: Negative.   Respiratory: Positive for chest tightness and shortness of breath.   Cardiovascular: Positive for chest  pain.  Gastrointestinal: Positive for abdominal pain, constipation and diarrhea.  Endocrine: Positive for cold intolerance, heat intolerance and polyuria.  Genitourinary: Positive for frequency.  Musculoskeletal: Positive for neck stiffness.  Skin: Negative.   Allergic/Immunologic: Negative.   Neurological: Negative.   Hematological: Negative.   Psychiatric/Behavioral: Positive for agitation.    Social History   Socioeconomic History  . Marital status: Divorced    Spouse name: Not on file  . Number of children: 2  . Years of education: Not on file  . Highest education level: 12th grade  Occupational History  . 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 Financial resource strain: Not hard at all  . Food insecurity:    Worry: Never true    Inability: Never true  . Transportation needs:    Medical: No    Non-medical: No  Tobacco Use  . Smoking status: Former Smoker    Years: 40.00    Types: Cigarettes    Start date: 06/29/2015    Last attempt to quit: 08/20/2017    Years since quitting: 0.1  . Smokeless tobacco: Never Used  Substance and Sexual Activity  . Alcohol use: No    Alcohol/week: 0.0 oz  . Drug use: No  . Sexual activity: Not on file  Lifestyle  . Physical activity:    Days per week: Not on file    Minutes per session: Not on file  . Stress: Not at all  Relationships  . Social connections:  Talks on phone: Not on file    Gets together: Not on file    Attends religious service: Not on file    Active member of club or organization: Not on file    Attends meetings of clubs or organizations: Not on file    Relationship status: Not on file  . Intimate partner violence:    Fear of current or ex partner: Not on file    Emotionally abused: Not on file    Physically abused: Not on file    Forced sexual activity: Not on file  Other Topics Concern  . Not on file  Social History Narrative  . Not on file    Past Medical History:    Diagnosis Date  . Hemorrhoids   . Tubal pregnancy 1977     Patient Active Problem List   Diagnosis Date Noted  . Estrogen deficiency 07/13/2015  . Fibula fracture 07/13/2015  . Blood pressure elevated without history of HTN 06/07/2015  . 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 Memory loss 06/07/2015  . Vitamin D deficiency 06/07/2015  . Abnormal mammogram of right breast 08/12/2014  . Mass of breast, right 07/13/2014  . Osteopenia 05/10/2009  . GERD (gastroesophageal reflux disease) 02/21/2008  . Panic disorder 02/18/2007  . Compulsive tobacco user syndrome 02/20/2005  . Hyperlipidemia 04/08/2002    Past Surgical History:  Procedure Laterality Date  . ABDOMINAL HYSTERECTOMY  1977   Total-due to ectopic pregnancy  . APPENDECTOMY  1977    Her family history includes Diabetes in her father; Heart attack in her paternal grandmother; Heart disease in her father; Lupus in her paternal grandfather and sister; Melanoma in her father; Ovarian cancer in her mother.      Current Outpatient Medications:  .  diphenhydramine-acetaminophen (TYLENOL PM) 25-500 MG TABS tablet, Take 1 tablet by mouth at bedtime as needed., Disp: , Rfl:  .  ezetimibe (ZETIA) 10 MG tablet, Take 1 tablet (10 mg total) by mouth daily. (Patient not taking: Reported on 09/22/2017), Disp: 30 tablet, Rfl: 5 .  nicotine polacrilex (NICORETTE) 4 MG gum, Take 4 mg by mouth as needed for smoking cessation., Disp: , Rfl:  .  Probiotic Product (PROBIOTIC DAILY PO), Take by mouth daily., Disp: , Rfl:  .  pyridOXINE (VITAMIN B-6) 50 MG tablet, Take 50 mg by mouth daily., Disp: , Rfl:  .  Vitamin D, Ergocalciferol, (DRISDOL) 50000 UNITS CAPS capsule, TAKE 1 CAPSULE BY MOUTH EVERY WEEK FOR 12 WEEKS. (Patient not taking: Reported on 09/22/2017), Disp: 12 capsule, Rfl: 3  Patient Care Team: Birdie Sons, MD as PCP - General (Family Medicine) Bary Castilla, Forest Gleason, MD as Consulting Physician (General Surgery)      Objective:   Vitals: BP 120/80 (BP Location: Right Arm, Patient Position: Sitting, Cuff Size: Large)   Pulse 68   Temp 98.1 F (36.7 C) (Oral)   Resp 16   Ht 5\' 9"  (1.753 m)   Wt 194 lb (88 kg)   SpO2 98%   BMI 28.65 kg/m   Physical Exam   General Appearance:    Alert, cooperative, no distress, appears stated age  Head:    Normocephalic, without obvious abnormality, atraumatic  Eyes:    PERRL, conjunctiva/corneas clear, EOM's intact, fundi    benign, both eyes  Ears:    Normal TM's and external ear canals, both ears  Nose:   Nares normal, septum midline, mucosa normal, no drainage    or sinus tenderness  Throat:  Lips, mucosa, and tongue normal; teeth and gums normal  Neck:   Supple, symmetrical, trachea midline, no adenopathy;    thyroid:  no enlargement/tenderness/nodules; no carotid   bruit or JVD  Back:     Symmetric, no curvature, ROM normal, no CVA tenderness  Lungs:     Clear to auscultation bilaterally, respirations unlabored  Chest Wall:    No tenderness or deformity   Heart:    Regular rate and rhythm, S1 and S2 normal, no murmur, rub   or gallop  Breast Exam:    normal appearance, no masses or tenderness  Abdomen:     Soft, non-tender, bowel sounds active all four quadrants,    no masses, no organomegaly  Pelvic:    deferred  Extremities:   Extremities normal, atraumatic, no cyanosis or edema  Pulses:   2+ and symmetric all extremities  Skin:   Skin color, texture, turgor normal, no rashes or lesions. Small sebaceous cyst behind left ear  Lymph nodes:   Cervical, supraclavicular, and axillary nodes normal  Neurologic:   CNII-XII intact, normal strength, sensation and reflexes    throughout    Activities of Daily Living In your present state of health, do you have any difficulty performing the following activities: 09/22/2017  Hearing? N  Vision? N  Difficulty concentrating or making decisions? Y  Walking or climbing stairs? Y  Comment Due to SOB.   Dressing or bathing? N  Doing errands, shopping? N  Preparing Food and eating ? N  Using the Toilet? N  In the past six months, have you accidently leaked urine? Y  Comment Occasionally, does not have to wear protection.   Do you have problems with loss of bowel control? N  Managing your Medications? N  Managing your Finances? N  Housekeeping or managing your Housekeeping? N  Some recent data might be hidden    Fall Risk Assessment Fall Risk  09/22/2017 07/13/2015  Falls in the past year? No Yes  Number falls in past yr: - 1  Injury with Fall? - Yes  Follow up - Falls prevention discussed     Depression Screen PHQ 2/9 Scores 09/22/2017 07/13/2015  PHQ - 2 Score 4 0  PHQ- 9 Score 14 -      Assessment & Plan:    Annual Physical Reviewed patient's Family Medical History Reviewed and updated list of patient's medical providers Assessment of cognitive impairment was done Assessed patient's functional ability Established a written schedule for health screening [place] Completed and Reviewed  Exercise Activities and Dietary recommendations Goals    None      Immunization History  Administered Date(s) Administered  . Influenza-Unspecified 01/24/2015  . Pneumococcal Conjugate-13 06/28/2013  . Pneumococcal Polysaccharide-23 07/13/2015  . Tdap 05/02/2009  . Zoster 05/02/2009    Health Maintenance  Topic Date Due  . Hepatitis C Screening  12-Feb-1951  . MAMMOGRAM  08/10/2016  . DEXA SCAN  07/27/2017  . INFLUENZA VACCINE  11/06/2017  . COLONOSCOPY  07/26/2018  . TETANUS/TDAP  05/03/2019  . PNA vac Low Risk Adult  Completed     Discussed health benefits of physical activity, and encouraged her to engage in regular exercise appropriate for her age and condition.    -------------------------------------------------------------------------  1. Annual physical exam Overweight, otherwise normal exam. encourage regular exercise. Counseled on  improved efficacy of Shingrix over zostavax and encourage to check with insurance regarding coverage.   2. Compulsive tobacco user syndrome She has stopped smoking, but  using nicotine gum. Should work on weaning gum.   3. Osteopenia, unspecified location BMD pending - VITAMIN D 25 Hydroxy (Vit-D Deficiency, Fractures)  4. Vitamin D deficiency Not taking supplements consistently.  - VITAMIN D 25 Hydroxy (Vit-D Deficiency, Fractures)  5. Hyperlipidemia, unspecified hyperlipidemia type Counseled on increased risk of heart disease and stroke with uncontrolled cholesterol, and that Zetia is not a statin and may very well tolerate it with  No side effects. Encouraged increase fiber in diet.  - Comprehensive metabolic panel - Lipid panel - CBC  6. History of smoking 30 or more pack years  - Ambulatory Referral for Lung Cancer Scre  7. Need for hepatitis C screening test  - Hepatitis C antibody    Lelon Huh, MD  [facility]

## 2017-10-23 ENCOUNTER — Telehealth: Payer: Self-pay | Admitting: *Deleted

## 2017-10-23 DIAGNOSIS — Z122 Encounter for screening for malignant neoplasm of respiratory organs: Secondary | ICD-10-CM

## 2017-10-23 DIAGNOSIS — Z87891 Personal history of nicotine dependence: Secondary | ICD-10-CM

## 2017-10-23 LAB — CBC
HEMATOCRIT: 39.3 % (ref 34.0–46.6)
HEMOGLOBIN: 13.2 g/dL (ref 11.1–15.9)
MCH: 28.8 pg (ref 26.6–33.0)
MCHC: 33.6 g/dL (ref 31.5–35.7)
MCV: 86 fL (ref 79–97)
Platelets: 363 10*3/uL (ref 150–450)
RBC: 4.59 x10E6/uL (ref 3.77–5.28)
RDW: 15 % (ref 12.3–15.4)
WBC: 7 10*3/uL (ref 3.4–10.8)

## 2017-10-23 LAB — COMPREHENSIVE METABOLIC PANEL
ALBUMIN: 4.8 g/dL (ref 3.6–4.8)
ALT: 31 IU/L (ref 0–32)
AST: 26 IU/L (ref 0–40)
Albumin/Globulin Ratio: 1.8 (ref 1.2–2.2)
Alkaline Phosphatase: 61 IU/L (ref 39–117)
BILIRUBIN TOTAL: 0.2 mg/dL (ref 0.0–1.2)
BUN / CREAT RATIO: 13 (ref 12–28)
BUN: 12 mg/dL (ref 8–27)
CO2: 21 mmol/L (ref 20–29)
Calcium: 9.6 mg/dL (ref 8.7–10.3)
Chloride: 102 mmol/L (ref 96–106)
Creatinine, Ser: 0.91 mg/dL (ref 0.57–1.00)
GFR calc Af Amer: 74 mL/min/{1.73_m2} (ref >59)
GFR calc non Af Amer: 65 mL/min/{1.73_m2} (ref >59)
GLUCOSE: 106 mg/dL — AB (ref 65–99)
Globulin, Total: 2.7 g/dL (ref 1.5–4.5)
Potassium: 4.7 mmol/L (ref 3.5–5.2)
Sodium: 139 mmol/L (ref 134–144)
Total Protein: 7.5 g/dL (ref 6.0–8.5)

## 2017-10-23 LAB — LIPID PANEL
Chol/HDL Ratio: 8.2 ratio — ABNORMAL HIGH (ref 0.0–4.4)
Cholesterol, Total: 280 mg/dL — ABNORMAL HIGH (ref 100–199)
HDL: 34 mg/dL — ABNORMAL LOW (ref >39)
Triglycerides: 428 mg/dL — ABNORMAL HIGH (ref 0–149)

## 2017-10-23 LAB — VITAMIN D 25 HYDROXY (VIT D DEFICIENCY, FRACTURES): Vit D, 25-Hydroxy: 14 ng/mL — ABNORMAL LOW (ref 30.0–100.0)

## 2017-10-23 LAB — HEPATITIS C ANTIBODY: Hep C Virus Ab: <0.1 s/co ratio (ref 0.0–0.9)

## 2017-10-23 LAB — HM DEXA SCAN

## 2017-10-23 NOTE — Telephone Encounter (Signed)
Received referral for initial lung cancer screening scan. Contacted patient and obtained smoking history,(former, quit 08/20/17, 42 pack year) as well as answering questions related to screening process. Patient denies signs of lung cancer such as weight loss or hemoptysis. Patient denies comorbidity that would prevent curative treatment if lung cancer were found. Patient is scheduled for shared decision making visit and CT scan on 11/06/17.

## 2017-10-24 ENCOUNTER — Other Ambulatory Visit: Payer: Self-pay | Admitting: Family Medicine

## 2017-10-24 ENCOUNTER — Telehealth: Payer: Self-pay | Admitting: Family Medicine

## 2017-10-24 MED ORDER — EZETIMIBE 10 MG PO TABS: 10.0000 mg | ORAL_TABLET | Freq: Every day | ORAL | 5 refills | Status: DC

## 2017-10-24 MED ORDER — VITAMIN D (ERGOCALCIFEROL) 1.25 MG (50000 UNIT) PO CAPS: ORAL_CAPSULE | ORAL | 4 refills | Status: AC

## 2017-10-24 NOTE — Telephone Encounter (Signed)
Pt advised.   Thanks,   -Laura  

## 2017-10-24 NOTE — Telephone Encounter (Signed)
She is borderline for osteoporosis in her hips. Need to take vitamin D consistently every single week. Need to repeat BMD in 2 years. May need to start Fosamax if not improving at follow up.

## 2017-11-03 ENCOUNTER — Telehealth: Payer: Self-pay | Admitting: Nurse Practitioner

## 2017-11-05 ENCOUNTER — Encounter: Payer: Self-pay | Admitting: *Deleted

## 2017-11-06 ENCOUNTER — Ambulatory Visit
Admission: RE | Admit: 2017-11-06 | Discharge: 2017-11-06 | Disposition: A | Discharge status: Home / Self Care | Payer: PPO | Source: Ambulatory Visit | Attending: Nurse Practitioner | Admitting: Nurse Practitioner

## 2017-11-06 ENCOUNTER — Inpatient Hospital Stay: Payer: PPO | Attending: Nurse Practitioner | Admitting: Nurse Practitioner

## 2017-11-06 DIAGNOSIS — I2510 Atherosclerotic heart disease of native coronary artery without angina pectoris: Secondary | ICD-10-CM | POA: Diagnosis not present

## 2017-11-06 DIAGNOSIS — Z87891 Personal history of nicotine dependence: Secondary | ICD-10-CM

## 2017-11-06 DIAGNOSIS — J438 Other emphysema: Secondary | ICD-10-CM | POA: Insufficient documentation

## 2017-11-06 DIAGNOSIS — I700 Atherosclerosis of aorta: Secondary | ICD-10-CM | POA: Insufficient documentation

## 2017-11-06 DIAGNOSIS — J432 Centrilobular emphysema: Secondary | ICD-10-CM | POA: Diagnosis not present

## 2017-11-06 DIAGNOSIS — Z122 Encounter for screening for malignant neoplasm of respiratory organs: Secondary | ICD-10-CM | POA: Insufficient documentation

## 2017-11-06 DIAGNOSIS — K760 Fatty (change of) liver, not elsewhere classified: Secondary | ICD-10-CM | POA: Diagnosis not present

## 2017-11-06 NOTE — Progress Notes (Signed)
In accordance with CMS guidelines, patient has met eligibility criteria including age, absence of signs or symptoms of lung cancer.  Social History   Tobacco Use  . Smoking status: Former Smoker    Packs/day: 1.00    Years: 42.00    Pack years: 42.00    Types: Cigarettes    Start date: 06/29/2015    Last attempt to quit: 08/20/2017    Years since quitting: 0.2  . Smokeless tobacco: Never Used  . Tobacco comment: previously smoked >1ppd since her 37s  Substance Use Topics  . Alcohol use: No    Alcohol/week: 0.0 oz  . Drug use: No      A shared decision-making session was conducted prior to the performance of CT scan. This includes one or more decision aids, includes benefits and harms of screening, follow-up diagnostic testing, over-diagnosis, false positive rate, and total radiation exposure.   Counseling on the importance of adherence to annual lung cancer LDCT screening, impact of co-morbidities, and ability or willingness to undergo diagnosis and treatment is imperative for compliance of the program.   Counseling on the importance of continued smoking cessation for former smokers; the importance of smoking cessation for current smokers, and information about tobacco cessation interventions have been given to patient including [place] and 1800 quit [place] programs.   Written order for lung cancer screening with LDCT has been given to the patient and any and all questions have been answered to the best of my abilities.    Yearly follow up will be coordinated by Burgess Estelle, Thoracic Navigator.  Beckey Rutter, DNP, AGNP-[facility] [facility] [phone] (work cell) [phone] (office) 11/06/17 2:50 PM

## 2017-11-10 ENCOUNTER — Encounter: Payer: Self-pay | Admitting: *Deleted

## 2017-11-10 ENCOUNTER — Encounter: Payer: Self-pay | Admitting: Family Medicine

## 2017-11-10 DIAGNOSIS — I7121 Aneurysm of the ascending aorta, without rupture: Secondary | ICD-10-CM | POA: Insufficient documentation

## 2017-11-10 DIAGNOSIS — I77810 Thoracic aortic ectasia: Secondary | ICD-10-CM | POA: Insufficient documentation

## 2017-11-10 DIAGNOSIS — K760 Fatty (change of) liver, not elsewhere classified: Secondary | ICD-10-CM | POA: Insufficient documentation

## 2017-11-10 DIAGNOSIS — I700 Atherosclerosis of aorta: Secondary | ICD-10-CM | POA: Insufficient documentation

## 2017-12-09 ENCOUNTER — Encounter: Payer: Self-pay | Admitting: Family Medicine

## 2017-12-09 ENCOUNTER — Ambulatory Visit (INDEPENDENT_AMBULATORY_CARE_PROVIDER_SITE_OTHER): Payer: PPO | Admitting: Family Medicine

## 2017-12-09 VITALS — BP 144/87 | HR 70 | Temp 98.5°F | Resp 16 | Ht 69.0 in | Wt 199.0 lb

## 2017-12-09 DIAGNOSIS — H6981 Other specified disorders of Eustachian tube, right ear: Secondary | ICD-10-CM | POA: Diagnosis not present

## 2017-12-09 DIAGNOSIS — E559 Vitamin D deficiency, unspecified: Secondary | ICD-10-CM | POA: Diagnosis not present

## 2017-12-09 DIAGNOSIS — L299 Pruritus, unspecified: Secondary | ICD-10-CM

## 2017-12-09 DIAGNOSIS — Z23 Encounter for immunization: Secondary | ICD-10-CM

## 2017-12-09 DIAGNOSIS — E785 Hyperlipidemia, unspecified: Secondary | ICD-10-CM | POA: Diagnosis not present

## 2017-12-09 NOTE — Addendum Note (Signed)
Addended by: Birdie Sons on: 12/09/2017 10:48 AM   Modules accepted: Orders

## 2017-12-09 NOTE — Progress Notes (Addendum)
Patient: Alexis Hart Female    DOB: 1950/12/20   66 y.o.   MRN: [id number] Visit Date: 12/09/2017  Today's Provider: Lelon Huh, MD   Chief Complaint  Patient presents with  . Follow-up   Subjective:    HPI    Lipid/Cholesterol, Follow-up:   Last seen for this 2 months ago.  Management since that visit includes; labs checked.  Started Zetia qd. Advised to follow up in 6-8 weeks to check lipids.  Last Lipid Panel:    Component Value Date/Time   CHOL 280 (H) 10/22/2017 1047   TRIG 428 (H) 10/22/2017 1047   HDL 34 (L) 10/22/2017 1047   CHOLHDL 8.2 (H) 10/22/2017 1047   [place] Comment 10/22/2017 1047    She reports good compliance with treatment. She is having side effects. Since starting medication, patient states she has had itchy skin all over, weakness in her hands and feet and right sided abdominal pain.  Wt Readings from Last 3 Encounters:  12/09/17 199 lb (90.3 kg)  11/06/17 196 lb (88.9 kg)  10/22/17 194 lb (88 kg)    ------------------------------------------------------------------------  Vitamin D deficiency From 10/22/2017-labs checked. Vitamin D low. Started vitamin D 50,000 units once weekly. Patient reports good compliance with treatment.  Lab Results  Component Value Date   VD25OH 14.0 (L) 10/22/2017     Allergies  Allergen Reactions  . Atorvastatin Other (See Comments)  . Colestipol Other (See Comments)  . Losartan Potassium     rash  . Pravastatin Other (See Comments)    Fatigue  . Rosuvastatin Other (See Comments)    Rash  . Latex Rash     Current Outpatient Medications:  .  diphenhydramine-acetaminophen (TYLENOL PM) 25-500 MG TABS tablet, Take 1 tablet by mouth at bedtime as needed., Disp: , Rfl:  .  ezetimibe (ZETIA) 10 MG tablet, Take 1 tablet (10 mg total) by mouth daily., Disp: 30 tablet, Rfl: 5 .  nicotine polacrilex (NICORETTE) 4 MG gum, Take 4 mg by mouth as needed for smoking cessation., Disp: , Rfl:  .   Probiotic Product (PROBIOTIC DAILY PO), Take by mouth daily., Disp: , Rfl:  .  pyridOXINE (VITAMIN B-6) 50 MG tablet, Take 50 mg by mouth daily., Disp: , Rfl:  .  Vitamin D, Ergocalciferol, (DRISDOL) 50000 units CAPS capsule, TAKE 1 CAPSULE BY MOUTH EVERY WEEK, Disp: 12 capsule, Rfl: 4  Review of Systems  Constitutional: Positive for fatigue. Negative for appetite change, chills and fever.  HENT: Positive for ear pain (pressure in the right ear x 2 weeks).   Respiratory: Positive for shortness of breath. Negative for chest tightness.   Cardiovascular: Negative for chest pain and palpitations.  Gastrointestinal: Positive for abdominal pain (right side). Negative for nausea and vomiting.  Skin:       Itchy skin  Neurological: Positive for weakness (in hands and feet). Negative for dizziness.    Social History   Tobacco Use  . Smoking status: Former Smoker    Packs/day: 1.00    Years: 42.00    Pack years: 42.00    Types: Cigarettes    Start date: 06/29/2015    Last attempt to quit: 08/20/2017    Years since quitting: 0.3  . Smokeless tobacco: Never Used  . Tobacco comment: previously smoked >1ppd since her 17s  Substance Use Topics  . Alcohol use: No    Alcohol/week: 0.0 standard drinks   Objective:   BP (!) 144/87 (BP Location: Right  Arm, Cuff Size: Large)   Pulse 70   Temp 98.5 F (36.9 C) (Oral)   Resp 16   Ht 5\' 9"  (1.753 m)   Wt 199 lb (90.3 kg)   SpO2 99% Comment: room air  BMI 29.39 kg/m  Vitals:   12/09/17 0946 12/09/17 0949  BP: (!) 143/87 (!) 144/87  Pulse: 70   Resp: 16   Temp: 98.5 F (36.9 C)   TempSrc: Oral   SpO2: 99%   Weight: 199 lb (90.3 kg)   Height: 5\' 9"  (1.753 m)      Physical Exam  General appearance: alert, well developed, well nourished, cooperative and in no distress Head: Normocephalic, without obvious abnormality, atraumatic Respiratory: Respirations even and unlabored, normal respiratory rate Extremities: No gross  deformities Skin: Skin color, texture, turgor normal. No rashes seen  Psych: Appropriate mood and affect. Neurologic: Mental status: Alert, oriented to person, place, and time, thought content appropriate.     Assessment & Plan:     1. Hyperlipidemia, unspecified hyperlipidemia type Tolerating initiation of ezetimibe.  - Lipid panel  2. Vitamin D deficiency On weekly high dose vitamin d - VITAMIN D 25 Hydroxy (Vit-D Deficiency, Fractures)  3. Itching Possible secondary to one of new medications above. Consider daily non-sedating antihistamine.    5. Need for influenza vaccination  - Flu vaccine HIGH DOSE PF  6. Eustachian tube dysfunction Patient also reported that she feel like there is water in ear with pressure sensation but no pain, for the last few weeks. Has had no other allergy or cold sx. Noted to have some fluid behind TM but no erythema. She can try taking occasional OTC sudafed. Advised to call if she develops any pain or worsening of sx.       Lelon Huh, MD  [facility]

## 2017-12-10 LAB — VITAMIN D 25 HYDROXY (VIT D DEFICIENCY, FRACTURES): VIT D 25 HYDROXY: 22.1 ng/mL — AB (ref 30.0–100.0)

## 2017-12-10 LAB — LIPID PANEL
CHOLESTEROL TOTAL: 256 mg/dL — AB (ref 100–199)
Chol/HDL Ratio: 6.9 ratio — ABNORMAL HIGH (ref 0.0–4.4)
HDL: 37 mg/dL — AB (ref >39)
Triglycerides: 434 mg/dL — ABNORMAL HIGH (ref 0–149)

## 2018-09-14 ENCOUNTER — Telehealth: Payer: Self-pay | Admitting: Family Medicine

## 2018-09-14 NOTE — Telephone Encounter (Signed)
Please advise is overdue for cologuard. Please send order if she is ok with it.

## 2018-09-15 NOTE — Telephone Encounter (Signed)
I called patient and advised her as below. Patient states she wants to hold off on doing the cologuard until next year.

## 2018-11-04 ENCOUNTER — Other Ambulatory Visit: Payer: Self-pay

## 2018-11-20 ENCOUNTER — Telehealth: Payer: Self-pay | Admitting: *Deleted

## 2018-11-20 DIAGNOSIS — Z87891 Personal history of nicotine dependence: Secondary | ICD-10-CM

## 2018-11-20 DIAGNOSIS — Z122 Encounter for screening for malignant neoplasm of respiratory organs: Secondary | ICD-10-CM

## 2018-11-20 NOTE — Telephone Encounter (Signed)
Patient has been notified that annual lung cancer screening low dose CT scan is due currently or will be in near future. Confirmed that patient is within the age range of 55-77, and asymptomatic, (no signs or symptoms of lung cancer). Patient denies illness that would prevent curative treatment for lung cancer if found. Verified smoking history, (former, quit 08/20/17, 42 pack year). The shared decision making visit was done 11/06/17. Patient is agreeable for CT scan being scheduled.

## 2018-11-24 ENCOUNTER — Other Ambulatory Visit: Payer: Self-pay

## 2018-11-24 ENCOUNTER — Ambulatory Visit
Admission: RE | Admit: 2018-11-24 | Discharge: 2018-11-24 | Disposition: A | Discharge status: Home / Self Care | Payer: PPO | Source: Ambulatory Visit | Attending: Oncology | Admitting: Oncology

## 2018-11-24 DIAGNOSIS — Z87891 Personal history of nicotine dependence: Secondary | ICD-10-CM | POA: Diagnosis not present

## 2018-11-24 DIAGNOSIS — Z122 Encounter for screening for malignant neoplasm of respiratory organs: Secondary | ICD-10-CM | POA: Insufficient documentation

## 2018-11-27 ENCOUNTER — Encounter: Payer: Self-pay | Admitting: *Deleted

## 2019-02-27 ENCOUNTER — Emergency Department: Payer: PPO

## 2019-02-27 ENCOUNTER — Other Ambulatory Visit: Payer: Self-pay

## 2019-02-27 ENCOUNTER — Emergency Department
Admission: EM | Admit: 2019-02-27 | Discharge: 2019-02-27 | Disposition: A | Discharge status: Home / Self Care | Payer: PPO | Attending: Student in an Organized Health Care Education/Training Program | Admitting: Student in an Organized Health Care Education/Training Program

## 2019-02-27 DIAGNOSIS — R079 Chest pain, unspecified: Secondary | ICD-10-CM | POA: Diagnosis not present

## 2019-02-27 DIAGNOSIS — R4020 Unspecified coma: Secondary | ICD-10-CM | POA: Diagnosis not present

## 2019-02-27 DIAGNOSIS — Z79899 Other long term (current) drug therapy: Secondary | ICD-10-CM | POA: Insufficient documentation

## 2019-02-27 DIAGNOSIS — Z87891 Personal history of nicotine dependence: Secondary | ICD-10-CM | POA: Diagnosis not present

## 2019-02-27 DIAGNOSIS — Z91040 Latex allergy status: Secondary | ICD-10-CM | POA: Insufficient documentation

## 2019-02-27 DIAGNOSIS — E860 Dehydration: Secondary | ICD-10-CM | POA: Diagnosis not present

## 2019-02-27 DIAGNOSIS — R42 Dizziness and giddiness: Secondary | ICD-10-CM | POA: Diagnosis not present

## 2019-02-27 DIAGNOSIS — R0789 Other chest pain: Secondary | ICD-10-CM | POA: Diagnosis not present

## 2019-02-27 DIAGNOSIS — I10 Essential (primary) hypertension: Secondary | ICD-10-CM | POA: Diagnosis not present

## 2019-02-27 DIAGNOSIS — R270 Ataxia, unspecified: Secondary | ICD-10-CM | POA: Diagnosis not present

## 2019-02-27 LAB — CBC
HCT: 37.8 % (ref 36.0–46.0)
Hemoglobin: 12.7 g/dL (ref 12.0–15.0)
MCH: 28.3 pg (ref 26.0–34.0)
MCHC: 33.6 g/dL (ref 30.0–36.0)
MCV: 84.2 fL (ref 80.0–100.0)
Platelets: 347 10*3/uL (ref 150–400)
RBC: 4.49 MIL/uL (ref 3.87–5.11)
RDW: 13.8 % (ref 11.5–15.5)
WBC: 8 10*3/uL (ref 4.0–10.5)
nRBC: 0 % (ref 0.0–0.2)

## 2019-02-27 LAB — TROPONIN I (HIGH SENSITIVITY)
Troponin I (High Sensitivity): <2 ng/L (ref <18)
Troponin I (High Sensitivity): 3 ng/L (ref <18)

## 2019-02-27 LAB — BASIC METABOLIC PANEL
Anion gap: 11 (ref 5–15)
BUN: 11 mg/dL (ref 8–23)
CO2: 25 mmol/L (ref 22–32)
Calcium: 9.4 mg/dL (ref 8.9–10.3)
Chloride: 101 mmol/L (ref 98–111)
Creatinine, Ser: 0.93 mg/dL (ref 0.44–1.00)
GFR calc Af Amer: >60 mL/min (ref >60)
GFR calc non Af Amer: >60 mL/min (ref >60)
Glucose, Bld: 172 mg/dL — ABNORMAL HIGH (ref 70–99)
Potassium: 3.9 mmol/L (ref 3.5–5.1)
Sodium: 137 mmol/L (ref 135–145)

## 2019-02-27 MED ORDER — MECLIZINE HCL 25 MG PO TABS: 25.0000 mg | ORAL_TABLET | Freq: Two times a day (BID) | ORAL | 0 refills | Status: DC | PRN

## 2019-02-27 MED ORDER — MECLIZINE HCL 25 MG PO TABS
25.0000 mg | ORAL_TABLET | Freq: Once | ORAL | Status: AC
  Administered 2019-02-27: 25 mg via ORAL
  Filled 2019-02-27: qty 1

## 2019-02-27 NOTE — ED Notes (Signed)
X-ray at bedside

## 2019-02-27 NOTE — ED Provider Notes (Signed)
[facility] Emergency Department Provider Note    First MD Initiated Contact with Patient 02/27/19 1136     (approximate)  I have reviewed the triage vital signs and the nursing notes.   HISTORY  Chief Complaint Dizziness    HPI Alexis Hart is a 68 y.o. female below his past medical history presents the ER with sudden onset positional dizziness that occurred this morning.  States it was worse when laying back and with changing position.  States that after she stayed still for a time it would improve.  Denies any numbness or tingling.  No headaches.  Also did develop some chest discomfort after she developed the dizziness.  Denies any chest pain or pressure at this moment.  Arrives hypertensive but denies any history of high blood pressure.  Not any blood thinners. No history of vertigo     Past Medical History:  Diagnosis Date   Compulsive tobacco user syndrome 02/20/2005   > 30 pack year history    Hemorrhoids    Tubal pregnancy 1977   Family History  Problem Relation Age of Onset   Ovarian cancer Mother    Melanoma Father    Diabetes Father    Heart disease Father    Lupus Sister    Lupus Paternal Grandfather    Heart attack Paternal Grandmother    Past Surgical History:  Procedure Laterality Date   ABDOMINAL HYSTERECTOMY  1977   Total-due to ectopic pregnancy   APPENDECTOMY  1977   Patient Active Problem List   Diagnosis Date Noted   Aortic atherosclerosis ([place]) 11/10/2017   Thoracic aortic ectasia ([place]) 11/10/2017   Hepatic steatosis 11/10/2017   History of smoking 30 or more pack years 10/22/2017   Sebaceous cyst 10/22/2017   Estrogen deficiency 07/13/2015   Fibula fracture 07/13/2015   Blood pressure elevated without history of HTN 06/07/2015   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 Authorizing Provider  diphenhydramine-acetaminophen (TYLENOL PM) 25-500 MG TABS tablet Take 1 tablet by mouth at bedtime as needed.    [provider]  ezetimibe (ZETIA) 10 MG tablet Take 1 tablet (10 mg total) by mouth daily. 10/24/17   Birdie Sons, MD  meclizine (ANTIVERT) 25 MG tablet Take 1 tablet (25 mg total) by mouth 2 (two) times daily as needed for dizziness. 02/27/19   Merlyn Lot, MD  nicotine polacrilex (NICORETTE) 4 MG gum Take 4 mg by mouth as needed for smoking cessation.    [provider]  Probiotic Product (PROBIOTIC DAILY PO) Take by mouth daily.    [provider]  pyridOXINE (VITAMIN B-6) 50 MG tablet Take 50 mg by mouth daily.    [provider]  Vitamin D, Ergocalciferol, (DRISDOL) 50000 units CAPS capsule TAKE 1 CAPSULE BY MOUTH EVERY WEEK 10/24/17   Birdie Sons, MD    Allergies Atorvastatin, Colestipol, Losartan potassium, Pravastatin, Rosuvastatin, and Latex    Social History Social History   Tobacco Use   Smoking status: Former Smoker    Packs/day: 1.00    Years: 42.00    Pack years: 42.00  Types: Cigarettes    Start date: 06/29/2015    Quit date: 08/20/2017    Years since quitting: 1.5   Smokeless tobacco: Never Used   Tobacco comment: previously smoked >1ppd since her 3s  Substance Use Topics   Alcohol use: No    Alcohol/week: 0.0 standard drinks   Drug use: No    Review of Systems Patient denies headaches, rhinorrhea, blurry vision, numbness, shortness of breath, chest pain, edema, cough, abdominal pain, nausea, vomiting, diarrhea, dysuria, fevers, rashes or hallucinations unless otherwise stated above in  HPI. ____________________________________________   PHYSICAL EXAM:  VITAL SIGNS: Vitals:   02/27/19 1300 02/27/19 1446  BP: (!) 160/78 134/77  Pulse: 66   Resp: 14   Temp:    SpO2: 100%     Constitutional: Alert and oriented.  Eyes: Conjunctivae are normal.  Head: Atraumatic. Nose: No congestion/rhinnorhea. Mouth/Throat: Mucous membranes are moist.   Neck: No stridor. Painless ROM.  Cardiovascular: Normal rate, regular rhythm. Grossly normal heart sounds.  Good peripheral circulation. Respiratory: Normal respiratory effort.  No retractions. Lungs CTAB. Gastrointestinal: Soft and nontender. No distention. No abdominal bruits. No CVA tenderness. Genitourinary:  Musculoskeletal: No lower extremity tenderness nor edema.  No joint effusions. Neurologic:  CN- intact.  No facial droop, Normal FNF.  Normal heel to shin.  Sensation intact bilaterally. Normal speech and language. No gross focal neurologic deficits are appreciated. No gait instability.  Benign Hiints exam Skin:  Skin is warm, dry and intact. No rash noted. Psychiatric: Mood and affect are normal. Speech and behavior are normal.  ____________________________________________   LABS (all labs ordered are listed, but only abnormal results are displayed)  Results for orders placed or performed during the hospital encounter of 02/27/19 (from the past 24 hour(s))  Basic metabolic panel     Status: Abnormal   Collection Time: 02/27/19 11:56 AM  Result Value Ref Range   Sodium 137 135 - 145 mmol/L   Potassium 3.9 3.5 - 5.1 mmol/L   Chloride 101 98 - 111 mmol/L   CO2 25 22 - 32 mmol/L   Glucose, Bld 172 (H) 70 - 99 mg/dL   BUN 11 8 - 23 mg/dL   Creatinine, Ser 0.93 0.44 - 1.00 mg/dL   Calcium 9.4 8.9 - 10.3 mg/dL   GFR calc non Af Amer >60 >60 mL/min   GFR calc Af Amer >60 >60 mL/min   Anion gap 11 5 - 15  CBC     Status: None   Collection Time: 02/27/19 11:56 AM  Result Value Ref Range   WBC 8.0 4.0 - 10.5 K/uL    RBC 4.49 3.87 - 5.11 MIL/uL   Hemoglobin 12.7 12.0 - 15.0 g/dL   HCT 37.8 36.0 - 46.0 %   MCV 84.2 80.0 - 100.0 fL   MCH 28.3 26.0 - 34.0 pg   MCHC 33.6 30.0 - 36.0 g/dL   RDW 13.8 11.5 - 15.5 %   Platelets 347 150 - 400 K/uL   nRBC 0.0 0.0 - 0.2 %  Troponin I (High Sensitivity)     Status: None   Collection Time: 02/27/19 11:56 AM  Result Value Ref Range   Troponin I (High Sensitivity) <2 <18 ng/L   ____________________________________________  EKG My review and personal interpretation at Time: 11:47   Indication: dizziness  Rate: 80  Rhythm: sinus Axis: normal Other: normal intervals, no stemi or depressions ____________________________________________  RADIOLOGY  I personally reviewed all radiographic images ordered to evaluate for the above acute  complaints and reviewed radiology reports and findings.  These findings were personally discussed with the patient.  Please see medical record for radiology report.  ____________________________________________   PROCEDURES  Procedure(s) performed:  Procedures    Critical Care performed: no ____________________________________________   INITIAL IMPRESSION / ASSESSMENT AND PLAN / ED COURSE  Pertinent labs & imaging results that were available during my care of the patient were reviewed by me and considered in my medical decision making (see chart for details).   DDX: bppv, cva, mass, acs, dehydration, electrolyte abn  Alexis Hart is a 68 y.o. who presents to the ED with vertiginous symptoms as described above.  Patient well-appearing in no acute distress.  CT imaging ordered for above differential.  The patient will be placed on continuous pulse oximetry and telemetry for monitoring.  Laboratory evaluation will be sent to evaluate for the above complaints.     Clinical Course as of Feb 27 1516  Sat Feb 27, 2019  1312 CT head is negative given her presentation and age and risk factors will order MRI to further  exclude CVA.   [PR]  1515 Patient reassessed and feels improved.  MRI without any evidence of infarct.  Not consistent with CVA or TIA.  Most consistent with vertigo.  Will provide prescription for meclizine . Have discussed with the patient and available family all diagnostics and treatments performed thus far and all questions were answered to the best of my ability. The patient demonstrates understanding and agreement with plan.    [PR]    Clinical Course User Index [PR] Merlyn Lot, MD    The patient was evaluated in Emergency Department today for the symptoms described in the history of present illness. He/she was evaluated in the context of the global COVID-19 pandemic, which necessitated consideration that the patient might be at risk for infection with the SARS-CoV-2 virus that causes COVID-19. Institutional protocols and algorithms that pertain to the evaluation of patients at risk for COVID-19 are in a state of rapid change based on information released by regulatory bodies including the CDC and federal and state organizations. These policies and algorithms were followed during the patient's care in the ED.  As part of my medical decision making, I reviewed the following data within the [place] notes reviewed and incorporated, Labs reviewed, notes from prior ED visits and [place] Controlled Substance Database   ____________________________________________   FINAL CLINICAL IMPRESSION(S) / ED DIAGNOSES  Final diagnoses:  Dizziness      NEW MEDICATIONS STARTED DURING THIS VISIT:  New Prescriptions   MECLIZINE (ANTIVERT) 25 MG TABLET    Take 1 tablet (25 mg total) by mouth 2 (two) times daily as needed for dizziness.     Note:  This document was prepared using Dragon voice recognition software and may include unintentional dictation errors.    Merlyn Lot, MD 02/27/19 1517

## 2019-02-27 NOTE — ED Notes (Signed)
Daughter at bedside.

## 2019-02-27 NOTE — ED Notes (Signed)
Pt up to use bathroom 

## 2019-02-27 NOTE — ED Notes (Signed)
Patient transported to MRI 

## 2019-02-27 NOTE — ED Triage Notes (Signed)
Pt arrives via EMS from home- pt started feeling dizzy around 6AM- pt states yesterday she was feeling okay- pt has already taken 324 ASA- pt bp 194/103

## 2019-02-27 NOTE — ED Notes (Signed)
MRI on phone to do screening questions

## 2019-02-27 NOTE — ED Provider Notes (Signed)
Brain MRI is negative.  She is cleared for outpatient follow-up.   Earleen Newport, MD 02/27/19 [phone]

## 2019-02-27 NOTE — ED Notes (Signed)
Patient transported to CT 

## 2019-03-01 ENCOUNTER — Encounter: Payer: Self-pay | Admitting: Family Medicine

## 2019-03-01 NOTE — Progress Notes (Signed)
Patient: Alexis Hart Female    DOB: 06-10-1950   68 y.o.   MRN: [id number] Visit Date: 03/01/2019  Today's Provider: Lelon Huh, MD   No chief complaint on file.  Subjective:     HPI  Virtual Visit via Telephone Note  I connected with Mable Fill on 03/02/19 at  8:20 AM EST by telephone and verified that I am speaking with the correct person using two identifiers.  Location: Patient: Home Provider: [facility]   I discussed the limitations, risks, security and privacy concerns of performing an evaluation and management service by telephone and the availability of in person appointments. I also discussed with the patient that there may be a patient responsible charge related to this service. The patient expressed understanding and agreed to proceed.     Follow up ER visit  Patient was seen in ER for Dizziness which she describes as a spinning sensation on 02/27/2019. She states she had diarrhea, nausea and chills the same day as the onset of the spinning sensation. She was so dizzy she had to be taken to the ER by Ambulance. Treatment for this included prescribed Meclizine. She had normal head CT brain MRI. Unremarkable cbc and met b only remarkable for elevated glucose of 172.   She reports good compliance with treatment. She reports this condition is resolved. She has not had similar spinning episodes in the past.  ------------------------------------------------------------------------------------   Allergies  Allergen Reactions  . Atorvastatin Other (See Comments)  . Colestipol Other (See Comments)  . Losartan Potassium     rash  . Pravastatin Other (See Comments)    Fatigue  . Rosuvastatin Other (See Comments)    Rash  . Latex Rash     Current Outpatient Medications:  .  diphenhydramine-acetaminophen (TYLENOL PM) 25-500 MG TABS tablet, Take 1 tablet by mouth at bedtime as needed., Disp: , Rfl:  .  ezetimibe (ZETIA) 10 MG  tablet, Take 1 tablet (10 mg total) by mouth daily., Disp: 30 tablet, Rfl: 5 .  meclizine (ANTIVERT) 25 MG tablet, Take 1 tablet (25 mg total) by mouth 2 (two) times daily as needed for dizziness., Disp: 10 tablet, Rfl: 0 .  nicotine polacrilex (NICORETTE) 4 MG gum, Take 4 mg by mouth as needed for smoking cessation., Disp: , Rfl:  .  Probiotic Product (PROBIOTIC DAILY PO), Take by mouth daily., Disp: , Rfl:  .  pyridOXINE (VITAMIN B-6) 50 MG tablet, Take 50 mg by mouth daily., Disp: , Rfl:  .  Vitamin D, Ergocalciferol, (DRISDOL) 50000 units CAPS capsule, TAKE 1 CAPSULE BY MOUTH EVERY WEEK, Disp: 12 capsule, Rfl: 4  Review of Systems  Constitutional: Negative for appetite change, chills, fatigue and fever.  Respiratory: Negative for chest tightness and shortness of breath.   Cardiovascular: Negative for chest pain and palpitations.  Gastrointestinal: Negative for abdominal pain, nausea and vomiting.  Neurological: Negative for dizziness and weakness.    Social History   Tobacco Use  . Smoking status: Former Smoker    Packs/day: 1.00    Years: 42.00    Pack years: 42.00    Types: Cigarettes    Start date: 06/29/2015    Quit date: 08/20/2017    Years since quitting: 1.5  . Smokeless tobacco: Never Used  . Tobacco comment: previously smoked >1ppd since her 17s  Substance Use Topics  . Alcohol use: No    Alcohol/week: 0.0 standard drinks      Objective:  Physical Exam  Awake, alert, oriented x 3 in no distress.  No results found for any visits on 03/02/19.     Assessment & Plan    1. Vertigo Considering association with chills and diarrhea, which have since completely resolved, suspect this was related to transient viral infection. She is completely back to baseline since yesterday. She does have meclizine to take if sx return. She is to notify me if symptoms do return and will refer to ENT in that case.    I discussed the assessment and treatment plan with the patient.  The patient was provided an opportunity to ask questions and all were answered. The patient agreed with the plan and demonstrated an understanding of the instructions.   The patient was advised to call back or seek an in-person evaluation if the symptoms worsen or if the condition fails to improve as anticipated.  I provided 6 minutes of non-face-to-face time during this encounter.  The entirety of the information documented in the History of Present Illness, Review of Systems and Physical Exam were personally obtained by me. Portions of this information were initially documented by Meyer Cory, CMA and reviewed by me for thoroughness and accuracy.      Lelon Huh, MD  [facility]

## 2019-03-02 ENCOUNTER — Other Ambulatory Visit: Payer: Self-pay

## 2019-03-02 ENCOUNTER — Ambulatory Visit (INDEPENDENT_AMBULATORY_CARE_PROVIDER_SITE_OTHER): Payer: PPO | Admitting: Family Medicine

## 2019-03-02 DIAGNOSIS — R42 Dizziness and giddiness: Secondary | ICD-10-CM | POA: Diagnosis not present

## 2019-03-03 ENCOUNTER — Other Ambulatory Visit: Payer: Self-pay

## 2019-09-27 ENCOUNTER — Other Ambulatory Visit: Payer: Self-pay | Admitting: Family Medicine

## 2019-09-27 DIAGNOSIS — Z1211 Encounter for screening for malignant neoplasm of colon: Secondary | ICD-10-CM

## 2019-09-28 ENCOUNTER — Telehealth: Payer: Self-pay | Admitting: Family Medicine

## 2019-09-28 NOTE — Telephone Encounter (Signed)
Please advise patient she is due for Cologuard and have sent order to [facility]. She should get collection kit in the next week or two.

## 2019-09-29 NOTE — Telephone Encounter (Signed)
Left patient a voicemail advising her as stated below.

## 2019-10-01 ENCOUNTER — Telehealth: Payer: Self-pay | Admitting: Family Medicine

## 2019-10-01 NOTE — Telephone Encounter (Signed)
Copied from [place] [id number] [id number]. Topic: Medicare AWV >> Oct 01, 2019  8:38 AM Cher Nakai R wrote: Reason for CRM: Called patient to schedule Medicare Annual Wellness Visit with Nurse Health Advisor.   If patient returns call, please schedule for any date.  Last AWV completed: 09/22/2017  Appointment length should be  40 minutes  Thank you,  [place] [phone]

## 2019-11-15 ENCOUNTER — Telehealth: Payer: Self-pay | Admitting: *Deleted

## 2019-11-15 NOTE — Telephone Encounter (Signed)
Writer phoned patient on this date regarding annual lung cancer screening CT scan and left voicemail requesting patient phone [place] to schedule.

## 2019-11-16 ENCOUNTER — Telehealth: Payer: Self-pay | Admitting: *Deleted

## 2019-11-16 NOTE — Telephone Encounter (Signed)
(  11/16/2019) Pt notified that lung cancer screening imaging is due currently or in the near future. Verified smoking history (Former Smoker since 2019, 1 ppd). Tentatively scheduled for 12/16/19 @ 9:30 SRW

## 2019-11-17 ENCOUNTER — Other Ambulatory Visit: Payer: Self-pay | Admitting: *Deleted

## 2019-11-17 DIAGNOSIS — Z87891 Personal history of nicotine dependence: Secondary | ICD-10-CM

## 2019-11-17 DIAGNOSIS — Z122 Encounter for screening for malignant neoplasm of respiratory organs: Secondary | ICD-10-CM

## 2019-11-23 ENCOUNTER — Telehealth: Payer: Self-pay | Admitting: Family Medicine

## 2019-11-23 NOTE — Telephone Encounter (Signed)
Copied from [place] [phone]. Topic: Medicare AWV >> Nov 23, 2019  3:15 PM Cher Nakai R wrote: Reason for CRM:   Left message for patient to call back and schedule Medicare Annual Wellness Visit (AWV) either virtually or in office.  No hx of AWW  Please schedule at anytime with [facility] Health Advisor.

## 2019-12-16 ENCOUNTER — Ambulatory Visit
Admission: RE | Admit: 2019-12-16 | Discharge: 2019-12-16 | Disposition: A | Discharge status: Home / Self Care | Payer: PPO | Source: Ambulatory Visit | Attending: Nurse Practitioner | Admitting: Nurse Practitioner

## 2019-12-16 ENCOUNTER — Other Ambulatory Visit: Payer: Self-pay

## 2019-12-16 DIAGNOSIS — Z122 Encounter for screening for malignant neoplasm of respiratory organs: Secondary | ICD-10-CM | POA: Diagnosis not present

## 2019-12-16 DIAGNOSIS — Z87891 Personal history of nicotine dependence: Secondary | ICD-10-CM | POA: Insufficient documentation

## 2019-12-22 ENCOUNTER — Encounter: Payer: Self-pay | Admitting: *Deleted

## 2019-12-22 ENCOUNTER — Encounter: Payer: Self-pay | Admitting: Family Medicine

## 2019-12-22 DIAGNOSIS — J439 Emphysema, unspecified: Secondary | ICD-10-CM | POA: Insufficient documentation

## 2019-12-24 ENCOUNTER — Telehealth: Payer: Self-pay

## 2019-12-24 NOTE — Telephone Encounter (Signed)
Copied from [place] [phone]. Topic: General - Other >> Dec 24, 2019  2:19 PM Wynetta Emery, [place] C wrote:  Reason for CRM: pt called in for imaging results.    [phone] - for call back.

## 2019-12-24 NOTE — Telephone Encounter (Signed)
Patient requesting results of low dose CT scan lung cancer screening. Please review results in chart.

## 2019-12-24 NOTE — Telephone Encounter (Signed)
no tumors, but she does have emphysema. she has small aortic aneurysm. not big enough to worry about, but will need to check with a CT angiogram in 1 year.  She also has cholesterol plaques in aorta and coronary arteries.  Is she still taking the ezetimibe for cholesterol anymore. If not then please send in prescription refill. She needs follow up in a month to check labs.

## 2019-12-27 NOTE — Telephone Encounter (Signed)
Advised patient as below. She reports that she has not taken anything for cholesterol for several months. She does not remember taking Zetia 10mg , but she does have pravastatin 20mg  at home. Can she start back taking that medication? She can't remember why she stopped it, but she has a full bottle and prefers to try that first. Please advise. Thanks!

## 2019-12-27 NOTE — Telephone Encounter (Signed)
Ok, she should start back on the pravastatin once a day. Let me know if any problems with the medication. Need to recheck lipids in 52month. s

## 2019-12-27 NOTE — Telephone Encounter (Signed)
Patient advised and agrees to restart Pravastatin. Patient will call back in 3 months to have labs rechecked.

## 2020-02-08 ENCOUNTER — Ambulatory Visit (INDEPENDENT_AMBULATORY_CARE_PROVIDER_SITE_OTHER): Payer: PPO | Admitting: Adult Health

## 2020-02-08 ENCOUNTER — Other Ambulatory Visit: Payer: Self-pay

## 2020-02-08 ENCOUNTER — Encounter: Payer: Self-pay | Admitting: Adult Health

## 2020-02-08 VITALS — BP 177/72 | HR 70 | Temp 99.0°F | Resp 16 | Wt 202.4 lb

## 2020-02-08 DIAGNOSIS — D170 Benign lipomatous neoplasm of skin and subcutaneous tissue of head, face and neck: Secondary | ICD-10-CM

## 2020-02-08 DIAGNOSIS — L02212 Cutaneous abscess of back [any part, except buttock]: Secondary | ICD-10-CM

## 2020-02-08 MED ORDER — CEPHALEXIN 500 MG PO CAPS: 500.0000 mg | ORAL_CAPSULE | Freq: Two times a day (BID) | ORAL | 0 refills | Status: DC

## 2020-02-08 MED ORDER — DOXYCYCLINE HYCLATE 100 MG PO TABS: 100.0000 mg | ORAL_TABLET | Freq: Two times a day (BID) | ORAL | 0 refills | Status: DC

## 2020-02-08 NOTE — Patient Instructions (Signed)
Cephalexin Tablets or Capsules What is this medicine? CEPHALEXIN (sef a LEX in) is a cephalosporin antibiotic. It treats some infections caused Skin Abscess  A skin abscess is an infected area on or under your skin that contains a collection of pus and other material. An abscess may also be called a furuncle, carbuncle, or boil. An abscess can occur in or on almost any part of your body. Some abscesses break open (rupture) on their own. Most continue to get worse unless they are treated. The infection can spread deeper into the body and eventually into your blood, which can make you feel ill. Treatment usually involves draining the abscess. What are the causes? An abscess occurs when germs, like bacteria, pass through your skin and cause an infection. This may be caused by:  A scrape or cut on your skin.  A puncture wound through your skin, including a needle injection or insect bite.  Blocked oil or sweat glands.  Blocked and infected hair follicles.  A cyst that forms beneath your skin (sebaceous cyst) and becomes infected. What increases the risk? This condition is more likely to develop in people who:  Have a weak body defense system (immune system).  Have diabetes.  Have dry and irritated skin.  Get frequent injections or use illegal IV drugs.  Have a foreign body in a wound, such as a splinter.  Have problems with their lymph system or veins. What are the signs or symptoms? Symptoms of this condition include:  A painful, firm bump under the skin.  A bump with pus at the top. This may break through the skin and drain. Other symptoms include:  Redness surrounding the abscess site.  Warmth.  Swelling of the lymph nodes (glands) near the abscess.  Tenderness.  A sore on the skin. How is this diagnosed? This condition may be diagnosed based on:  A physical exam.  Your medical history.  A sample of pus. This may be used to find out what is causing the  infection.  Blood tests.  Imaging tests, such as an ultrasound, CT scan, or MRI. How is this treated? A small abscess that drains on its own may not need treatment. Treatment for larger abscesses may include:  Moist heat or heat pack applied to the area several times a day.  A procedure to drain the abscess (incision and drainage).  Antibiotic medicines. For a severe abscess, you may first get antibiotics through an IV and then change to antibiotics by mouth. Follow these instructions at home: Medicines   Take over-the-counter and prescription medicines only as told by your health care provider.  If you were prescribed an antibiotic medicine, take it as told by your health care provider. Do not stop taking the antibiotic even if you start to feel better. Abscess care   If you have an abscess that has not drained, apply heat to the affected area. Use the heat source that your health care provider recommends, such as a moist heat pack or a heating pad. ? Place a towel between your skin and the heat source. ? Leave the heat on for 20-30 minutes. ? Remove the heat if your skin turns bright red. This is especially important if you are unable to feel pain, heat, or cold. You may have a greater risk of getting burned.  Follow instructions from your health care provider about how to take care of your abscess. Make sure you: ? Cover the abscess with a bandage (dressing). ? Change your   dressing or gauze as told by your health care provider. ? Wash your hands with soap and water before you change the dressing or gauze. If soap and water are not available, use hand sanitizer.  Check your abscess every day for signs of a worsening infection. Check for: ? More redness, swelling, or pain. ? More fluid or blood. ? Warmth. ? More pus or a bad smell. General instructions  To avoid spreading the infection: ? Do not share personal care items, towels, or hot tubs with others. ? Avoid making skin  contact with other people.  Keep all follow-up visits as told by your health care provider. This is important. Contact a health care provider if you have:  More redness, swelling, or pain around your abscess.  More fluid or blood coming from your abscess.  Warm skin around your abscess.  More pus or a bad smell coming from your abscess.  A fever.  Muscle aches.  Chills or a general ill feeling. Get help right away if you:  Have severe pain.  See red streaks on your skin spreading away from the abscess. Summary  A skin abscess is an infected area on or under your skin that contains a collection of pus and other material.  A small abscess that drains on its own may not need treatment.  Treatment for larger abscesses may include having a procedure to drain the abscess and taking an antibiotic. This information is not intended to replace advice given to you by your health care provider. Make sure you discuss any questions you have with your health care provider. Document Revised: 07/16/2018 Document Reviewed: 05/08/2017 Elsevier Patient Education  2020 [employer]  by bacteria. It will not work for colds, the flu, or other viruses. This medicine may be used for other purposes; ask your health care provider or pharmacist if you have questions. COMMON BRAND NAME(S): Biocef, Daxbia, Keflex, Keftab What should I tell my health care provider before I take this medicine? They need to know if you have any of these conditions:  kidney disease  stomach or intestine problems, especially colitis  an unusual or allergic reaction to cephalexin, other cephalosporins, penicillins, other antibiotics, medicines, foods, dyes or preservatives  pregnant or trying to get pregnant  breast-feeding How should I use this medicine? Take this drug by mouth. Take it as directed on the prescription label at the same time every day. You can take it with or without food. If it upsets your stomach,  take it with food. Take all of this drug unless your health care provider tells you to stop it early. Keep taking it even if you think you are better. Talk to your health care provider about the use of this drug in children. While it may be prescribed for selected conditions, precautions do apply. Overdosage: If you think you have taken too much of this medicine contact a poison control center or emergency room at once. NOTE: This medicine is only for you. Do not share this medicine with others. What if I miss a dose? If you miss a dose, take it as soon as you can. If it is almost time for your next dose, take only that dose. Do not take double or extra doses. What may interact with this medicine?  probenecid  some other antibiotics This list may not describe all possible interactions. Give your health care provider a list of all the medicines, herbs, non-prescription drugs, or dietary supplements you use. Also tell them if   you smoke, drink alcohol, or use illegal drugs. Some items may interact with your medicine. What should I watch for while using this medicine? Tell your doctor or health care provider if your symptoms do not begin to improve in a few days. This medicine may cause serious skin reactions. They can happen weeks to months after starting the medicine. Contact your health care provider right away if you notice fevers or flu-like symptoms with a rash. The rash may be red or purple and then turn into blisters or peeling of the skin. Or, you might notice a red rash with swelling of the face, lips or lymph nodes in your neck or under your arms. Do not treat diarrhea with over the counter products. Contact your doctor if you have diarrhea that lasts more than 2 days or if it is severe and watery. If you have diabetes, you may get a false-positive result for sugar in your urine. Check with your doctor or health care provider. What side effects may I notice from receiving this medicine? Side  effects that you should report to your doctor or health care professional as soon as possible:  allergic reactions like skin rash, itching or hives, swelling of the face, lips, or tongue  breathing problems  pain or trouble passing urine  redness, blistering, peeling or loosening of the skin, including inside the mouth  severe or watery diarrhea  unusually weak or tired  yellowing of the eyes, skin Side effects that usually do not require medical attention (report to your doctor or health care professional if they continue or are bothersome):  gas or heartburn  genital or anal irritation  headache  joint or muscle pain  nausea, vomiting This list may not describe all possible side effects. Call your doctor for medical advice about side effects. You may report side effects to FDA at 1-800-FDA-1088. Where should I keep my medicine? Keep out of the reach of children and pets. Store at room temperature between 20 and 25 degrees C (68 and 77 degrees F). Throw away any unused drug after the expiration date. NOTE: This sheet is a summary. It may not cover all possible information. If you have questions about this medicine, talk to your doctor, pharmacist, or health care provider.  2020 Elsevier/Gold Standard (2018-10-30 11:27:00) Doxycycline tablets or capsules What is this medicine? DOXYCYCLINE (dox i SYE kleen) is a tetracycline antibiotic. It kills certain bacteria or stops their growth. It is used to treat many kinds of infections, like dental, skin, respiratory, and urinary tract infections. It also treats acne, Lyme disease, malaria, and certain sexually transmitted infections. This medicine may be used for other purposes; ask your health care provider or pharmacist if you have questions. COMMON BRAND NAME(S): Acticlate, Adoxa, Adoxa CK, Adoxa Pak, Adoxa TT, Alodox, Avidoxy, Doxal, LYMEPAK, Mondoxyne NL, Monodox, Morgidox 1x, Morgidox 1x Kit, Morgidox 2x, Morgidox 2x Kit,  NutriDox, Ocudox, [place], [place], Vibra-Tabs, Vibramycin What should I tell my health care provider before I take this medicine? They need to know if you have any of these conditions:  liver disease  long exposure to sunlight like working outdoors  stomach problems like colitis  an unusual or allergic reaction to doxycycline, tetracycline antibiotics, other medicines, foods, dyes, or preservatives  pregnant or trying to get pregnant  breast-feeding How should I use this medicine? Take this medicine by mouth with a full glass of water. Follow the directions on the prescription label. It is best to take this medicine without food,  but if it upsets your stomach take it with food. Take your medicine at regular intervals. Do not take your medicine more often than directed. Take all of your medicine as directed even if you think you are better. Do not skip doses or stop your medicine early. Talk to your pediatrician regarding the use of this medicine in children. While this drug may be prescribed for selected conditions, precautions do apply. Overdosage: If you think you have taken too much of this medicine contact a poison control center or emergency room at once. NOTE: This medicine is only for you. Do not share this medicine with others. What if I miss a dose? If you miss a dose, take it as soon as you can. If it is almost time for your next dose, take only that dose. Do not take double or extra doses. What may interact with this medicine?  antacids  barbiturates  birth control pills  bismuth subsalicylate  carbamazepine  methoxyflurane  other antibiotics  phenytoin  vitamins that contain iron  warfarin This list may not describe all possible interactions. Give your health care provider a list of all the medicines, herbs, non-prescription drugs, or dietary supplements you use. Also tell them if you smoke, drink alcohol, or use illegal drugs. Some items may interact with your  medicine. What should I watch for while using this medicine? Tell your doctor or health care professional if your symptoms do not improve. Do not treat diarrhea with over the counter products. Contact your doctor if you have diarrhea that lasts more than 2 days or if it is severe and watery. Do not take this medicine just before going to bed. It may not dissolve properly when you lay down and can cause pain in your throat. Drink plenty of fluids while taking this medicine to also help reduce irritation in your throat. This medicine can make you more sensitive to the sun. Keep out of the sun. If you cannot avoid being in the sun, wear protective clothing and use sunscreen. Do not use sun lamps or tanning beds/booths. Birth control pills may not work properly while you are taking this medicine. Talk to your doctor about using an extra method of birth control. If you are being treated for a sexually transmitted infection, avoid sexual contact until you have finished your treatment. Your sexual partner may also need treatment. Avoid antacids, aluminum, calcium, magnesium, and iron products for 4 hours before and 2 hours after taking a dose of this medicine. If you are using this medicine to prevent malaria, you should still protect yourself from contact with mosquitos. Stay in screened-in areas, use mosquito nets, keep your body covered, and use an insect repellent. What side effects may I notice from receiving this medicine? Side effects that you should report to your doctor or health care professional as soon as possible:  allergic reactions like skin rash, itching or hives, swelling of the face, lips, or tongue  difficulty breathing  fever  itching in the rectal or genital area  pain on swallowing  rash, fever, and swollen lymph nodes  redness, blistering, peeling or loosening of the skin, including inside the mouth  severe stomach pain or cramps  unusual bleeding or bruising  unusually  weak or tired  yellowing of the eyes or skin Side effects that usually do not require medical attention (report to your doctor or health care professional if they continue or are bothersome):  diarrhea  loss of appetite  nausea, vomiting This  list may not describe all possible side effects. Call your doctor for medical advice about side effects. You may report side effects to FDA at 1-800-FDA-1088. Where should I keep my medicine? Keep out of the reach of children. Store at room temperature, below 30 degrees C (86 degrees F). Protect from light. Keep container tightly closed. Throw away any unused medicine after the expiration date. Taking this medicine after the expiration date can make you seriously ill. NOTE: This sheet is a summary. It may not cover all possible information. If you have questions about this medicine, talk to your doctor, pharmacist, or health care provider.  2020 Elsevier/Gold Standard (2018-06-25 13:44:53)

## 2020-02-08 NOTE — Progress Notes (Signed)
Established patient visit   Patient: Alexis Hart   DOB: 1950-06-06   69 y.o. Female  MRN: [id number] Visit Date: 02/08/2020  Today's healthcare provider: Marcille Buffy, FNP   Chief Complaint  Patient presents with  . Recurrent Skin Infections   Subjective    HPI  Patient presents in office today with concerns of multiple boils. Patient reports that she has a boil on her left side of neck that has been present for the past 15 years has gotten larger in the last year.   Patient reports in the past 7 days she has noticed another boil appear in her mid back that she describes as tender. Patient states that it feels that boil has grown larger and states when touched clear drainage comes out.   She has been taking some amoxicillin she had at home for one week without improvement.    Patient  denies any fever, body aches,chills, rash, chest pain, shortness of breath, nausea, vomiting, or diarrhea.   Patient Active Problem List   Diagnosis Date Noted  . Abscess of back 02/08/2020  . Emphysema of lung ([place]) 12/22/2019  . Aortic atherosclerosis ([place]) 11/10/2017  . Thoracic ascending aortic aneurysm ([place]) 11/10/2017  . Hepatic steatosis 11/10/2017  . History of smoking 30 or more pack years 10/22/2017  . Sebaceous cyst 10/22/2017  . Estrogen deficiency 07/13/2015  . Fibula fracture 07/13/2015  . Blood pressure elevated without history of HTN 06/07/2015  . 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 Memory loss 06/07/2015  . Vitamin D deficiency 06/07/2015  . Abnormal mammogram of right breast 08/12/2014  . Mass of breast, right 07/13/2014  . Osteopenia 05/10/2009  . GERD (gastroesophageal reflux disease) 02/21/2008  . Panic disorder 02/18/2007  . Hyperlipidemia 04/08/2002   Past Medical History:  Diagnosis Date  . Compulsive tobacco user syndrome 02/20/2005   > 30 pack year history   . Hemorrhoids   . Tubal pregnancy 1977   Past Surgical History:    Procedure Laterality Date  . ABDOMINAL HYSTERECTOMY  1977   Total-due to ectopic pregnancy  . APPENDECTOMY  1977   Social History   Tobacco Use  . Smoking status: Former Smoker    Packs/day: 1.00    Years: 42.00    Pack years: 42.00    Types: Cigarettes    Start date: 06/29/2015    Quit date: 08/20/2017    Years since quitting: 2.4  . Smokeless tobacco: Never Used  . Tobacco comment: previously smoked >1ppd since her 17s  Vaping Use  . Vaping Use: Never used  Substance Use Topics  . Alcohol use: No    Alcohol/week: 0.0 standard drinks  . Drug use: No   Social History   Socioeconomic History  . Marital status: Divorced    Spouse name: Not on file  . Number of children: 2  . Years of education: Not on file  . Highest education level: 12th grade  Occupational History  . Occupation: Optometrist    Comment: retired  Tobacco Use  . Smoking status: Former Smoker    Packs/day: 1.00    Years: 42.00    Pack years: 42.00    Types: Cigarettes    Start date: 06/29/2015    Quit date: 08/20/2017    Years since quitting: 2.4  . Smokeless tobacco: Never Used  . Tobacco comment: previously smoked >1ppd since her 74s  Vaping Use  . Vaping Use: Never used  Substance and Sexual Activity  .  Alcohol use: No    Alcohol/week: 0.0 standard drinks  . Drug use: No  . Sexual activity: Not on file  Other Topics Concern  . Not on file  Social History Narrative  . Not on file   Social Determinants of Health   Financial Resource Strain:   . Difficulty of Paying Living Expenses: Not on file  Food Insecurity:   . Worried About Charity fundraiser in the Last Year: Not on file  . Ran Out of Food in the Last Year: Not on file  Transportation Needs:   . Lack of Transportation (Medical): Not on file  . Lack of Transportation (Non-Medical): Not on file  Physical Activity:   . Days of Exercise per Week: Not on file  . Minutes of Exercise per Session: Not on file  Stress:   . Feeling of  Stress : Not on file  Social Connections:   . Frequency of Communication with Friends and Family: Not on file  . Frequency of Social Gatherings with Friends and Family: Not on file  . Attends Religious Services: Not on file  . Active Member of Clubs or Organizations: Not on file  . Attends Archivist Meetings: Not on file  . Marital Status: Not on file  Intimate Partner Violence:   . Fear of Current or Ex-Partner: Not on file  . Emotionally Abused: Not on file  . Physically Abused: Not on file  . Sexually Abused: Not on file   Family Status  Relation Name Status  . Mother  Deceased  . Father  Alive  . Sister  Alive  . PGF  Deceased  . PGM  Deceased   Family History  Problem Relation Age of Onset  . Ovarian cancer Mother   . Melanoma Father   . Diabetes Father   . Heart disease Father   . Lupus Sister   . Lupus Paternal Grandfather   . Heart attack Paternal Grandmother    Allergies  Allergen Reactions  . Atorvastatin Other (See Comments)  . Colestipol Other (See Comments)  . Losartan Potassium     rash  . Pravastatin Other (See Comments)    Fatigue  . Rosuvastatin Other (See Comments)    Rash  . Latex Rash       Medications: Outpatient Medications Prior to Visit  Medication Sig  . diphenhydramine-acetaminophen (TYLENOL PM) 25-500 MG TABS tablet Take 1 tablet by mouth at bedtime as needed.  . meclizine (ANTIVERT) 25 MG tablet Take 1 tablet (25 mg total) by mouth 2 (two) times daily as needed for dizziness.  . nicotine polacrilex (NICORETTE) 4 MG gum Take 4 mg by mouth as needed for smoking cessation.  . pravastatin (PRAVACHOL) 20 MG tablet Take 20 mg by mouth daily.  . Probiotic Product (PROBIOTIC DAILY PO) Take by mouth daily.  Marland Kitchen pyridOXINE (VITAMIN B-6) 50 MG tablet Take 50 mg by mouth daily.  . Vitamin D, Ergocalciferol, (DRISDOL) 50000 units CAPS capsule TAKE 1 CAPSULE BY MOUTH EVERY WEEK (Patient not taking: Reported on 02/08/2020)   No  facility-administered medications prior to visit.    Review of Systems  Last CBC Lab Results  Component Value Date   WBC 8.0 02/27/2019   HGB 12.7 02/27/2019   HCT 37.8 02/27/2019   MCV 84.2 02/27/2019   MCH 28.3 02/27/2019   RDW 13.8 02/27/2019   PLT 347 33/35/4562   Last metabolic panel Lab Results  Component Value Date   GLUCOSE 172 (H) 02/27/2019  NA 137 02/27/2019   K 3.9 02/27/2019   CL 101 02/27/2019   CO2 25 02/27/2019   BUN 11 02/27/2019   CREATININE 0.93 02/27/2019   GFRNONAA >60 02/27/2019   GFRAA >60 02/27/2019   CALCIUM 9.4 02/27/2019   PHOS 4.2 07/13/2015   PROT 7.5 10/22/2017   ALBUMIN 4.8 10/22/2017   LABGLOB 2.7 10/22/2017   AGRATIO 1.8 10/22/2017   BILITOT 0.2 10/22/2017   ALKPHOS 61 10/22/2017   AST 26 10/22/2017   ALT 31 10/22/2017   ANIONGAP 11 02/27/2019   Last lipids Lab Results  Component Value Date   CHOL 256 (H) 12/09/2017   HDL 37 (L) 12/09/2017   LDLCALC Comment 12/09/2017   TRIG 434 (H) 12/09/2017   CHOLHDL 6.9 (H) 12/09/2017   Last hemoglobin A1c No results found for: HGBA1C Last thyroid functions Lab Results  Component Value Date   TSH 1.63 07/11/2014   Last vitamin D Lab Results  Component Value Date   VD25OH 22.1 (L) 12/09/2017   Last vitamin B12 and Folate No results found for: VITAMINB12, FOLATE    Objective    BP (!) 177/72   Pulse 70   Temp 99 F (37.2 C) (Oral)   Resp 16   Wt 202 lb 6.4 oz (91.8 kg)   SpO2 98%   BMI 29.89 kg/m  BP Readings from Last 3 Encounters:  02/08/20 (!) 177/72  02/27/19 (!) 146/71  12/09/17 (!) 144/87   Wt Readings from Last 3 Encounters:  02/08/20 202 lb 6.4 oz (91.8 kg)  12/16/19 200 lb (90.7 kg)  02/27/19 200 lb (90.7 kg)      Physical Exam Vitals reviewed.  Constitutional:      General: She is not in acute distress.    Appearance: Normal appearance. She is obese. She is not ill-appearing, toxic-appearing or diaphoretic.  HENT:     Head: Normocephalic and  atraumatic.     Right Ear: External ear normal.     Left Ear: External ear normal.     Nose: Nose normal.     Mouth/Throat:     Mouth: Mucous membranes are moist.  Eyes:     Pupils: Pupils are equal, round, and reactive to light.  Cardiovascular:     Rate and Rhythm: Normal rate and regular rhythm.     Heart sounds: Normal heart sounds.  Pulmonary:     Effort: Pulmonary effort is normal. No respiratory distress.     Breath sounds: Normal breath sounds. No stridor. No wheezing, rhonchi or rales.  Chest:     Chest wall: No tenderness.  Abdominal:     Palpations: Abdomen is soft.  Musculoskeletal:     Cervical back: Normal range of motion and neck supple.  Skin:    General: Skin is warm.     Findings: Abscess and erythema present. No rash.       Psychiatric:        Mood and Affect: Mood normal.        Behavior: Behavior normal.        Thought Content: Thought content normal.        Judgment: Judgment normal.       No results found for any visits on 02/08/20.  Assessment & Plan     Abscess of back - Plan: cephALEXin (KEFLEX) 500 MG capsule, doxycycline (VIBRA-TABS) 100 MG tablet, CBC with Differential/Platelet, Comprehensive Metabolic Panel (CMET)  Lipoma of neck  Recommend dermatology evaluation of left neck lipoma versus cystic lesion. Present  x 15 years.   Back abscess is new, and has been draining, do not think I & D would be beneficial. Warm soaks to area with cloth. Will cover  With antibiotics as below.   Meds ordered this encounter  Medications  . cephALEXin (KEFLEX) 500 MG capsule    Sig: Take 1 capsule (500 mg total) by mouth 2 (two) times daily.    Dispense:  20 capsule    Refill:  0  . doxycycline (VIBRA-TABS) 100 MG tablet    Sig: Take 1 tablet (100 mg total) by mouth 2 (two) times daily.    Dispense:  20 tablet    Refill:  0   She declined referral at this time will readdress at follow up.   Red Flags discussed. The patient was given clear  instructions to go to ER or return to medical center if any red flags develop, symptoms do not improve, worsen or new problems develop. They verbalized understanding.  Return in about 1 week (around 02/15/2020), or if symptoms worsen or fail to improve, for at any time for any worsening symptoms, Go to Emergency room/ urgent care if worse.        Marcille Buffy, [place] [phone] (phone) [phone] (fax)  [place]

## 2020-02-09 LAB — CBC WITH DIFFERENTIAL/PLATELET
Basophils Absolute: 0.1 10*3/uL (ref 0.0–0.2)
Basos: 1 %
EOS (ABSOLUTE): 0.1 10*3/uL (ref 0.0–0.4)
Eos: 1 %
Hematocrit: 39.2 % (ref 34.0–46.6)
Hemoglobin: 12.9 g/dL (ref 11.1–15.9)
Immature Grans (Abs): 0 10*3/uL (ref 0.0–0.1)
Immature Granulocytes: 0 %
Lymphocytes Absolute: 3.2 10*3/uL — ABNORMAL HIGH (ref 0.7–3.1)
Lymphs: 43 %
MCH: 27.9 pg (ref 26.6–33.0)
MCHC: 32.9 g/dL (ref 31.5–35.7)
MCV: 85 fL (ref 79–97)
Monocytes Absolute: 0.6 10*3/uL (ref 0.1–0.9)
Monocytes: 7 %
Neutrophils Absolute: 3.5 10*3/uL (ref 1.4–7.0)
Neutrophils: 48 %
Platelets: 371 10*3/uL (ref 150–450)
RBC: 4.62 x10E6/uL (ref 3.77–5.28)
RDW: 13.8 % (ref 11.7–15.4)
WBC: 7.4 10*3/uL (ref 3.4–10.8)

## 2020-02-09 LAB — COMPREHENSIVE METABOLIC PANEL
ALT: 25 IU/L (ref 0–32)
AST: 17 IU/L (ref 0–40)
Albumin/Globulin Ratio: 1.6 (ref 1.2–2.2)
Albumin: 4.6 g/dL (ref 3.7–4.7)
Alkaline Phosphatase: 64 IU/L (ref 44–121)
BUN/Creatinine Ratio: 12 (ref 12–28)
BUN: 11 mg/dL (ref 8–27)
Bilirubin Total: <0.2 mg/dL (ref 0.0–1.2)
CO2: 23 mmol/L (ref 20–29)
Calcium: 9.8 mg/dL (ref 8.7–10.3)
Chloride: 102 mmol/L (ref 96–106)
Creatinine, Ser: 0.89 mg/dL (ref 0.57–1.00)
GFR calc Af Amer: 75 mL/min/{1.73_m2} (ref >59)
GFR calc non Af Amer: 65 mL/min/{1.73_m2} (ref >59)
Globulin, Total: 2.8 g/dL (ref 1.5–4.5)
Glucose: 102 mg/dL — ABNORMAL HIGH (ref 65–99)
Potassium: 4.8 mmol/L (ref 3.5–5.2)
Sodium: 139 mmol/L (ref 134–144)
Total Protein: 7.4 g/dL (ref 6.0–8.5)

## 2020-02-09 NOTE — Progress Notes (Signed)
CBC and CMP ok, glucose not fasting, mild increase in lymphocytes - likely due to skin infection.She has follow up scheduled.

## 2020-02-17 ENCOUNTER — Ambulatory Visit (INDEPENDENT_AMBULATORY_CARE_PROVIDER_SITE_OTHER): Payer: PPO | Admitting: Adult Health

## 2020-02-17 ENCOUNTER — Encounter: Payer: Self-pay | Admitting: Adult Health

## 2020-02-17 ENCOUNTER — Other Ambulatory Visit: Payer: Self-pay

## 2020-02-17 DIAGNOSIS — L02212 Cutaneous abscess of back [any part, except buttock]: Secondary | ICD-10-CM

## 2020-02-17 MED ORDER — MUPIROCIN 2 % EX OINT: 1.0000 "application " | TOPICAL_OINTMENT | Freq: Every day | CUTANEOUS | 0 refills | Status: DC | PRN

## 2020-02-17 MED ORDER — DOXYCYCLINE HYCLATE 100 MG PO TABS: 100.0000 mg | ORAL_TABLET | Freq: Two times a day (BID) | ORAL | 0 refills | Status: DC

## 2020-02-17 MED ORDER — CEPHALEXIN 500 MG PO CAPS: 500.0000 mg | ORAL_CAPSULE | Freq: Two times a day (BID) | ORAL | 0 refills | Status: DC

## 2020-02-17 NOTE — Progress Notes (Signed)
Established patient visit   Patient: Alexis Hart   DOB: [birth year]   69 y.o. Female  MRN: [id number] Visit Date: 02/17/2020  Today's healthcare provider: Marcille Buffy, FNP   Chief Complaint  Patient presents with   Follow-up   Subjective    HPI  Follow up for abscess of back  The patient was last seen for this 9 days ago. Changes made at last visit include patient was started on Doxycycline 100mg  and Keflex 500mg .  She reports excellent compliance with treatment area is almost gone she reports and the area has been itching.  She feels that condition is Improved. She is not having side effects.   ,Patient  denies any fever, body aches,chills, rash, chest pain, shortness of breath, nausea, vomiting, or diarrhea.   -----------------------------------------------------------------------------------------   Patient Active Problem List   Diagnosis Date Noted   Abscess of back 02/08/2020   Lipoma of neck 02/08/2020   Emphysema of lung ([place]) 12/22/2019   Aortic atherosclerosis ([place]) 11/10/2017   Thoracic ascending aortic aneurysm ([place]) 11/10/2017   Hepatic steatosis 11/10/2017   History of smoking 30 or more pack years 10/22/2017   Sebaceous cyst 10/22/2017   Estrogen deficiency 07/13/2015   Fibula fracture 07/13/2015   Blood pressure elevated without history of HTN 06/07/2015   History of Horner's syndrome 06/07/2015   Memory loss 06/07/2015   Vitamin D deficiency 06/07/2015   Abnormal mammogram of right breast 08/12/2014   Mass of breast, right 07/13/2014   Osteopenia 05/10/2009   GERD (gastroesophageal reflux disease) 02/21/2008   Panic disorder 02/18/2007   Hyperlipidemia 04/08/2002   Past Medical History:  Diagnosis Date   Compulsive tobacco user syndrome 02/20/2005   > 30 pack year history    Hemorrhoids    Tubal pregnancy 1977   Allergies  Allergen Reactions   Atorvastatin Other (See Comments)   Colestipol  Other (See Comments)   Losartan Potassium     rash   Pravastatin Other (See Comments)    Fatigue   Rosuvastatin Other (See Comments)    Rash   Latex Rash       Medications: Outpatient Medications Prior to Visit  Medication Sig   diphenhydramine-acetaminophen (TYLENOL PM) 25-500 MG TABS tablet Take 1 tablet by mouth at bedtime as needed.   meclizine (ANTIVERT) 25 MG tablet Take 1 tablet (25 mg total) by mouth 2 (two) times daily as needed for dizziness.   nicotine polacrilex (NICORETTE) 4 MG gum Take 4 mg by mouth as needed for smoking cessation.   pravastatin (PRAVACHOL) 20 MG tablet Take 20 mg by mouth daily.   Probiotic Product (PROBIOTIC DAILY PO) Take by mouth daily.   pyridOXINE (VITAMIN B-6) 50 MG tablet Take 50 mg by mouth daily.   [DISCONTINUED] cephALEXin (KEFLEX) 500 MG capsule Take 1 capsule (500 mg total) by mouth 2 (two) times daily.   [DISCONTINUED] doxycycline (VIBRA-TABS) 100 MG tablet Take 1 tablet (100 mg total) by mouth 2 (two) times daily.   Vitamin D, Ergocalciferol, (DRISDOL) 50000 units CAPS capsule TAKE 1 CAPSULE BY MOUTH EVERY WEEK (Patient not taking: Reported on 02/17/2020)   No facility-administered medications prior to visit.    Review of Systems  Last CBC Lab Results  Component Value Date   WBC 7.4 02/08/2020   HGB 12.9 02/08/2020   HCT 39.2 02/08/2020   MCV 85 02/08/2020   MCH 27.9 02/08/2020   RDW 13.8 02/08/2020   PLT 371 02/08/2020  Objective    BP (!) 169/82    Pulse 69    Temp 98.6 F (37 C) (Oral)    Resp 16    Wt 203 lb 3.2 oz (92.2 kg)    SpO2 99%    BMI 30.01 kg/m  BP Readings from Last 3 Encounters:  02/17/20 (!) 169/82  02/08/20 (!) 177/72  02/27/19 (!) 146/71   Wt Readings from Last 3 Encounters:  02/17/20 203 lb 3.2 oz (92.2 kg)  02/08/20 202 lb 6.4 oz (91.8 kg)  12/16/19 200 lb (90.7 kg)     Patient appers well, not sickly. Speaking in complete sentences. Patient moves on and off of exam table and  in room without difficulty. Gait is normal in hall and in room. Patient is oriented to person place time and situation. Patient answers questions appropriately and engages eye contact and verbal dialect with provider.   Physical Exam Vitals reviewed.  Constitutional:      General: She is not in acute distress.    Appearance: Normal appearance. She is not ill-appearing, toxic-appearing or diaphoretic.  HENT:     Head: Normocephalic and atraumatic.     Right Ear: There is no impacted cerumen.     Left Ear: There is no impacted cerumen.     Mouth/Throat:     Mouth: Mucous membranes are moist.  Eyes:     Conjunctiva/sclera: Conjunctivae normal.  Neck:     Vascular: No carotid bruit.  Cardiovascular:     Rate and Rhythm: Normal rate and regular rhythm.     Pulses: Normal pulses.     Heart sounds: Normal heart sounds. No murmur heard.  No friction rub. No gallop.   Pulmonary:     Effort: Pulmonary effort is normal. No respiratory distress.     Breath sounds: Normal breath sounds. No stridor. No wheezing, rhonchi or rales.  Chest:     Chest wall: No tenderness.  Abdominal:     Palpations: Abdomen is soft.  Musculoskeletal:        General: Normal range of motion.     Cervical back: Normal range of motion and neck supple. No rigidity or tenderness.  Lymphadenopathy:     Cervical: No cervical adenopathy.  Skin:    General: Skin is warm.     Findings: Abscess and erythema present.          Comments: Area left upper back abscess very mild 1 mm erythema approximately  of central area, no induration or drainage.   Psychiatric:        Mood and Affect: Mood normal.        Behavior: Behavior normal.        Thought Content: Thought content normal.        Judgment: Judgment normal.       No results found for any visits on 02/17/20.  Assessment & Plan     Abscess of back - Plan: doxycycline (VIBRA-TABS) 100 MG tablet, cephALEXin (KEFLEX) 500 MG capsule   Resolving well with  treatment, advised to finish antibiotics as discussed and also to continue warm compresses.  Return in about 1 week (around 02/24/2020), or if symptoms worsen or fail to improve, for at any time for any worsening symptoms, Go to Emergency room/ urgent care if worse.    Red Flags discussed. The patient was given clear instructions to go to ER or return to medical center if any red flags develop, symptoms do not improve, worsen or new problems develop. They  verbalized understanding.     Marcille Buffy, [place] [phone] (phone) [phone] (fax)  [place]

## 2020-02-17 NOTE — Patient Instructions (Addendum)
Warm compresses 2 times a day. All is improving, we will continue  same antibiotics for 5 days.  Will send in some Bactroban ointment for you to put a very thin layer on it once daily.   Skin Abscess  A skin abscess is an infected area on or under your skin that contains a collection of pus and other material. An abscess may also be called a furuncle, carbuncle, or boil. An abscess can occur in or on almost any part of your body. Some abscesses break open (rupture) on their own. Most continue to get worse unless they are treated. The infection can spread deeper into the body and eventually into your blood, which can make you feel ill. Treatment usually involves draining the abscess. What are the causes? An abscess occurs when germs, like bacteria, pass through your skin and cause an infection. This may be caused by:  A scrape or cut on your skin.  A puncture wound through your skin, including a needle injection or insect bite.  Blocked oil or sweat glands.  Blocked and infected hair follicles.  A cyst that forms beneath your skin (sebaceous cyst) and becomes infected. What increases the risk? This condition is more likely to develop in people who:  Have a weak body defense system (immune system).  Have diabetes.  Have dry and irritated skin.  Get frequent injections or use illegal IV drugs.  Have a foreign body in a wound, such as a splinter.  Have problems with their lymph system or veins. What are the signs or symptoms? Symptoms of this condition include:  A painful, firm bump under the skin.  A bump with pus at the top. This may break through the skin and drain. Other symptoms include:  Redness surrounding the abscess site.  Warmth.  Swelling of the lymph nodes (glands) near the abscess.  Tenderness.  A sore on the skin. How is this diagnosed? This condition may be diagnosed based on:  A physical exam.  Your medical history.  A sample of pus. This may be  used to find out what is causing the infection.  Blood tests.  Imaging tests, such as an ultrasound, CT scan, or MRI. How is this treated? A small abscess that drains on its own may not need treatment. Treatment for larger abscesses may include:  Moist heat or heat pack applied to the area several times a day.  A procedure to drain the abscess (incision and drainage).  Antibiotic medicines. For a severe abscess, you may first get antibiotics through an IV and then change to antibiotics by mouth. Follow these instructions at home: Medicines   Take over-the-counter and prescription medicines only as told by your health care provider.  If you were prescribed an antibiotic medicine, take it as told by your health care provider. Do not stop taking the antibiotic even if you start to feel better. Abscess care   If you have an abscess that has not drained, apply heat to the affected area. Use the heat source that your health care provider recommends, such as a moist heat pack or a heating pad. ? Place a towel between your skin and the heat source. ? Leave the heat on for 20-30 minutes. ? Remove the heat if your skin turns bright red. This is especially important if you are unable to feel pain, heat, or cold. You may have a greater risk of getting burned.  Follow instructions from your health care provider about how to take care  of your abscess. Make sure you: ? Cover the abscess with a bandage (dressing). ? Change your dressing or gauze as told by your health care provider. ? Wash your hands with soap and water before you change the dressing or gauze. If soap and water are not available, use hand sanitizer.  Check your abscess every day for signs of a worsening infection. Check for: ? More redness, swelling, or pain. ? More fluid or blood. ? Warmth. ? More pus or a bad smell. General instructions  To avoid spreading the infection: ? Do not share personal care items, towels, or hot  tubs with others. ? Avoid making skin contact with other people.  Keep all follow-up visits as told by your health care provider. This is important. Contact a health care provider if you have:  More redness, swelling, or pain around your abscess.  More fluid or blood coming from your abscess.  Warm skin around your abscess.  More pus or a bad smell coming from your abscess.  A fever.  Muscle aches.  Chills or a general ill feeling. Get help right away if you:  Have severe pain.  See red streaks on your skin spreading away from the abscess. Summary  A skin abscess is an infected area on or under your skin that contains a collection of pus and other material.  A small abscess that drains on its own may not need treatment.  Treatment for larger abscesses may include having a procedure to drain the abscess and taking an antibiotic. This information is not intended to replace advice given to you by your health care provider. Make sure you discuss any questions you have with your health care provider. Document Revised: 07/16/2018 Document Reviewed: 05/08/2017 Elsevier Patient Education  2020 [employer].

## 2020-03-09 ENCOUNTER — Encounter: Payer: Self-pay | Admitting: Adult Health

## 2020-03-09 ENCOUNTER — Other Ambulatory Visit: Payer: Self-pay

## 2020-03-09 ENCOUNTER — Ambulatory Visit (INDEPENDENT_AMBULATORY_CARE_PROVIDER_SITE_OTHER): Payer: PPO | Admitting: Adult Health

## 2020-03-09 VITALS — BP 162/79 | HR 68 | Temp 98.2°F | Resp 16 | Wt 204.8 lb

## 2020-03-09 DIAGNOSIS — D229 Melanocytic nevi, unspecified: Secondary | ICD-10-CM

## 2020-03-09 DIAGNOSIS — Z23 Encounter for immunization: Secondary | ICD-10-CM | POA: Diagnosis not present

## 2020-03-09 DIAGNOSIS — J439 Emphysema, unspecified: Secondary | ICD-10-CM

## 2020-03-09 DIAGNOSIS — L02212 Cutaneous abscess of back [any part, except buttock]: Secondary | ICD-10-CM | POA: Diagnosis not present

## 2020-03-09 NOTE — Patient Instructions (Addendum)
Health Maintenance, Female Adopting a healthy lifestyle and getting preventive care are important in promoting health and wellness. Ask your health care provider about:  The right schedule for you to have regular tests and exams.  Things you can do on your own to prevent diseases and keep yourself healthy. What should I know about diet, weight, and exercise? Eat a healthy diet   Eat a diet that includes plenty of vegetables, fruits, low-fat dairy products, and lean protein.  Do not eat a lot of foods that are high in solid fats, added sugars, or sodium. Maintain a healthy weight Body mass index (BMI) is used to identify weight problems. It estimates body fat based on height and weight. Your health care provider can help determine your BMI and help you achieve or maintain a healthy weight. Get regular exercise Get regular exercise. This is one of the most important things you can do for your health. Most adults should:  Exercise for at least 150 minutes each week. The exercise should increase your heart rate and make you sweat (moderate-intensity exercise).  Do strengthening exercises at least twice a week. This is in addition to the moderate-intensity exercise.  Spend less time sitting. Even light physical activity can be beneficial. Watch cholesterol and blood lipids Have your blood tested for lipids and cholesterol at 71 years of age, then have this test every 5 years. Have your cholesterol levels checked more often if:  Your lipid or cholesterol levels are high.  You are older than 71 years of age.  You are at high risk for heart disease. What should I know about cancer screening? Depending on your health history and family history, you may need to have cancer screening at various ages. This may include screening for:  Breast cancer.  Cervical cancer.  Colorectal cancer.  Skin cancer.  Lung cancer. What should I know about heart disease, diabetes, and high blood  pressure? Blood pressure and heart disease  High blood pressure causes heart disease and increases the risk of stroke. This is more likely to develop in people who have high blood pressure readings, are of African descent, or are overweight.  Have your blood pressure checked: ? Every 3-5 years if you are 54-65 years of age. ? Every year if you are 58 years old or older. Diabetes Have regular diabetes screenings. This checks your fasting blood sugar level. Have the screening done:  Once every three years after age 48 if you are at a normal weight and have a low risk for diabetes.  More often and at a younger age if you are overweight or have a high risk for diabetes. What should I know about preventing infection? Hepatitis B If you have a higher risk for hepatitis B, you should be screened for this virus. Talk with your health care provider to find out if you are at risk for hepatitis B infection. Hepatitis C Testing is recommended for:  Everyone born from [birth year] through 1967.  Anyone with known risk factors for hepatitis C. Sexually transmitted infections (STIs)  Get screened for STIs, including gonorrhea and chlamydia, if: ? You are sexually active and are younger than 71 years of age. ? You are older than 71 years of age and your health care provider tells you that you are at risk for this type of infection. ? Your sexual activity has changed since you were last screened, and you are at increased risk for chlamydia or gonorrhea. Ask your health care  provider if you are at risk.  Ask your health care provider about whether you are at high risk for HIV. Your health care provider may recommend a prescription medicine to help prevent HIV infection. If you choose to take medicine to prevent HIV, you should first get tested for HIV. You should then be tested every 3 months for as long as you are taking the medicine. Pregnancy  If you are about to stop having your period (premenopausal) and  you may become pregnant, seek counseling before you get pregnant.  Take 400 to 800 micrograms (mcg) of folic acid every day if you become pregnant.  Ask for birth control (contraception) if you want to prevent pregnancy. Osteoporosis and menopause Osteoporosis is a disease in which the bones lose minerals and strength with aging. This can result in bone fractures. If you are 48 years old or older, or if you are at risk for osteoporosis and fractures, ask your health care provider if you should:  Be screened for bone loss.  Take a calcium or vitamin D supplement to lower your risk of fractures.  Be given hormone replacement therapy (HRT) to treat symptoms of menopause. Follow these instructions at home: Lifestyle  Do not use any products that contain nicotine or tobacco, such as cigarettes, e-cigarettes, and chewing tobacco. If you need help quitting, ask your health care provider.  Do not use street drugs.  Do not share needles.  Ask your health care provider for help if you need support or information about quitting drugs. Alcohol use  Do not drink alcohol if: ? Your health care provider tells you not to drink. ? You are pregnant, may be pregnant, or are planning to become pregnant.  If you drink alcohol: ? Limit how much you use to 0-1 drink a day. ? Limit intake if you are breastfeeding.  Be aware of how much alcohol is in your drink. In the [place] one drink equals one 12 oz bottle of beer (355 mL), one 5 oz glass of wine (148 mL), or one 1 oz glass of hard liquor (44 mL). General instructions  Schedule regular health, dental, and eye exams.  Stay current with your vaccines.  Tell your health care provider if: ? You often feel depressed. ? You have ever been abused or do not feel safe at home. Summary  Adopting a healthy lifestyle and getting preventive care are important in promoting health and wellness.  Follow your health care provider's instructions about healthy  diet, exercising, and getting tested or screened for diseases.  Follow your health care provider's instructions on monitoring your cholesterol and blood pressure. This information is not intended to replace advice given to you by your health care provider. Make sure you discuss any questions you have with your health care provider. Document Revised: 03/18/2018 Document Reviewed: 03/18/2018 Elsevier Patient Education  2020 [place] dermatologist for mole on left upper back that appears very atypical. Mole A mole is a colored (pigmented) growth on the skin. Moles are very common. They are usually harmless, but some moles can become cancerous over time. What are the causes? Moles are caused when pigmented skin cells grow together in clusters instead of spreading out in the skin as they normally do. The reason why the skin cells grow together in clusters is not known. What increases the risk? You are more likely to develop a mole if you:  Have family members who have moles.  Are white.  Have  blond hair.  Are often outdoors and exposed to the sun.  Received phototherapy when you were a newborn baby.  Are female. What are the signs or symptoms? A mole may be:  [place] or black.  Flat or raised.  Smooth or wrinkled. How is this diagnosed? A mole is diagnosed with a skin exam. If your health care provider thinks a mole may be cancerous, all or part of the mole will be removed for testing (biopsy). How is this treated? Most moles are noncancerous (benign) and do not require treatment. If a mole is found to be cancerous, it will be removed. You may also choose to have a mole removed if it is causing pain or if you do not like the way it looks. Follow these instructions at home: General instructions   Every month, look for new moles and check your existing moles for changes. This is important because a change in a mole can mean that the mole has become  cancerous.  ABCDE changes in a mole indicate that you should be evaluated by your health care provider. ABCDE stands for: ? Asymmetry. This means the mole has an irregular shape. It is not round or oval. ? Border. This means the mole has an irregular or bumpy border. ? Color. This means the mole has multiple colors in it, including brown, black, blue, red, or tan. Note that it is normal for moles to get darker when a woman is pregnant or takes birth control pills. ? Diameter. This means the mole is more than 0.2 inches (6 mm) across. ? Evolving. This refers to any unusual changes or symptoms in the mole, such as pain, itching, stinging, sensitivity, or bleeding.  If you have a large number of moles, see a skin doctor (dermatologist) at least one time every year for a full-body skin check. Lifestyle   When you are outdoors, wear sunscreen with SPF 30 (sun protection factor 30) or higher.  Use an adequate amount of sunscreen to cover exposed areas of skin. Put it on 30 minutes before you go out. Reapply it every 2 hours or anytime you come out of the water.  When you are out in the sun, wear a broad-brimmed hat and clothing that covers your arms and legs. Wear wraparound sunglasses. Contact a health care provider if:  The size, shape, borders, or color of your mole changes.  Your mole, or the skin near the mole, becomes painful, sore, red, or swollen.  Your mole: ? Develops more than one color. ? Itches or bleeds. ? Becomes scaly, sheds skin, or oozes fluid. ? Becomes flat or develops raised areas. ? Becomes hard or soft.  You develop a new mole. Summary  A mole is a colored (pigmented) growth on the skin. Moles are very common. They are usually harmless, but some moles can become cancerous over time.  Every month, look for new moles and check your existing moles for changes. This is important because a change in a mole can mean that the mole has become cancerous.  If you have a  large number of moles, see a skin doctor (dermatologist) at least one time every year for a full-body skin check.  When you are outdoors, wear sunscreen with SPF 30 (sun protection factor 30) or higher. Reapply it every 2 hours or anytime you come out of the water.  Contact a health care provider if you notice changes in a mole or if you develop a new mole. This information is  not intended to replace advice given to you by your health care provider. Make sure you discuss any questions you have with your health care provider. Document Revised: 10/29/2018 Document Reviewed: 08/19/2017 Elsevier Patient Education  [place].

## 2020-03-09 NOTE — Progress Notes (Addendum)
Established patient visit   Patient: Alexis Hart   DOB: 1950-08-08   69 y.o. Female  MRN: [id number] Visit Date: 03/09/2020  Today's healthcare provider: Marcille Buffy, FNP   Chief Complaint  Patient presents with  . Follow-up   Subjective    HPI  Follow up for abscess of back  The patient was last seen for this 3 weeks ago. Changes made at last visit include started patient on Doxycycline 100mg  and Keflex 500mg  .Completed antibiotics 2 weeks ago.   She reports excellent compliance with treatment. She feels that condition is Resolved. . She is not having side effects.   Desires Covid booster today -.  Patient  denies any fever, body aches,chills, rash, chest pain, shortness of breath, nausea, vomiting, or diarrhea.   Denies dizziness, lightheadedness, pre syncopal or syncopal episodes.   -----------------------------------------------------------------------------------------   Patient Active Problem List   Diagnosis Date Noted  . Encounter for immunization 03/10/2020  . Abscess of back 02/08/2020  . Lipoma of neck 02/08/2020  . Emphysema of lung ([place]) 12/22/2019  . Aortic atherosclerosis ([place]) 11/10/2017  . Thoracic ascending aortic aneurysm ([place]) 11/10/2017  . Hepatic steatosis 11/10/2017  . History of smoking 30 or more pack years 10/22/2017  . Sebaceous cyst 10/22/2017  . Estrogen deficiency 07/13/2015  . Fibula fracture 07/13/2015  . Blood pressure elevated without history of HTN 06/07/2015  . 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 Memory loss 06/07/2015  . Vitamin D deficiency 06/07/2015  . Abnormal mammogram of right breast 08/12/2014  . Mass of breast, right 07/13/2014  . Osteopenia 05/10/2009  . GERD (gastroesophageal reflux disease) 02/21/2008  . Panic disorder 02/18/2007  . Hyperlipidemia 04/08/2002   Past Medical History:  Diagnosis Date  . Compulsive tobacco user syndrome 02/20/2005   > 30 pack year history   .  Hemorrhoids   . Tubal pregnancy 1977   Allergies  Allergen Reactions  . Atorvastatin Other (See Comments)  . Colestipol Other (See Comments)  . Losartan Potassium     rash  . Pravastatin Other (See Comments)    Fatigue  . Rosuvastatin Other (See Comments)    Rash  . Latex Rash       Medications: Outpatient Medications Prior to Visit  Medication Sig  . diphenhydramine-acetaminophen (TYLENOL PM) 25-500 MG TABS tablet Take 1 tablet by mouth at bedtime as needed.  . meclizine (ANTIVERT) 25 MG tablet Take 1 tablet (25 mg total) by mouth 2 (two) times daily as needed for dizziness.  . mupirocin ointment (BACTROBAN) 2 % Apply 1 application topically daily as needed.  . nicotine polacrilex (NICORETTE) 4 MG gum Take 4 mg by mouth as needed for smoking cessation.  . pravastatin (PRAVACHOL) 20 MG tablet Take 20 mg by mouth daily.  . Probiotic Product (PROBIOTIC DAILY PO) Take by mouth daily.  Marland Kitchen pyridOXINE (VITAMIN B-6) 50 MG tablet Take 50 mg by mouth daily.  . Vitamin D, Ergocalciferol, (DRISDOL) 50000 units CAPS capsule TAKE 1 CAPSULE BY MOUTH EVERY WEEK  . [DISCONTINUED] cephALEXin (KEFLEX) 500 MG capsule Take 1 capsule (500 mg total) by mouth 2 (two) times daily.  . [DISCONTINUED] doxycycline (VIBRA-TABS) 100 MG tablet Take 1 tablet (100 mg total) by mouth 2 (two) times daily.   No facility-administered medications prior to visit.    Review of Systems  Constitutional: Negative.   HENT: Negative.   Respiratory: Negative.   Gastrointestinal: Negative.   Genitourinary: Negative.   Musculoskeletal: Negative.   Skin:  Negative.   Neurological: Negative.   Psychiatric/Behavioral: Negative.     Last CBC Lab Results  Component Value Date   WBC 7.4 02/08/2020   HGB 12.9 02/08/2020   HCT 39.2 02/08/2020   MCV 85 02/08/2020   MCH 27.9 02/08/2020   RDW 13.8 02/08/2020   PLT 371 43/32/9518   Last metabolic panel Lab Results  Component Value Date   GLUCOSE 102 (H) 02/08/2020    NA 139 02/08/2020   K 4.8 02/08/2020   CL 102 02/08/2020   CO2 23 02/08/2020   BUN 11 02/08/2020   CREATININE 0.89 02/08/2020   GFRNONAA 65 02/08/2020   GFRAA 75 02/08/2020   CALCIUM 9.8 02/08/2020   PHOS 4.2 07/13/2015   PROT 7.4 02/08/2020   ALBUMIN 4.6 02/08/2020   LABGLOB 2.8 02/08/2020   AGRATIO 1.6 02/08/2020   BILITOT <0.2 02/08/2020   ALKPHOS 64 02/08/2020   AST 17 02/08/2020   ALT 25 02/08/2020   ANIONGAP 11 02/27/2019      Objective    BP (!) 162/79   Pulse 68   Temp 98.2 F (36.8 C) (Oral)   Resp 16   Wt 204 lb 12.8 oz (92.9 kg)   BMI 30.24 kg/m  BP Readings from Last 3 Encounters:  03/09/20 (!) 162/79  02/17/20 (!) 169/82  02/08/20 (!) 177/72   Wt Readings from Last 3 Encounters:  03/09/20 204 lb 12.8 oz (92.9 kg)  02/17/20 203 lb 3.2 oz (92.2 kg)  02/08/20 202 lb 6.4 oz (91.8 kg)      Physical Exam Vitals reviewed.  Constitutional:      General: She is not in acute distress.    Appearance: She is not ill-appearing, toxic-appearing or diaphoretic.  HENT:     Head: Normocephalic and atraumatic.     Right Ear: External ear normal. There is no impacted cerumen.     Left Ear: External ear normal. There is no impacted cerumen.     Nose: Nose normal.  Eyes:     Extraocular Movements: Extraocular movements intact.  Cardiovascular:     Rate and Rhythm: Normal rate and regular rhythm.     Pulses: Normal pulses.     Heart sounds: Normal heart sounds.  Pulmonary:     Effort: Pulmonary effort is normal. No respiratory distress.     Breath sounds: Normal breath sounds. No stridor. No wheezing, rhonchi or rales.  Chest:     Chest wall: No tenderness.  Abdominal:     General: There is no distension.     Palpations: Abdomen is soft.     Tenderness: There is no abdominal tenderness.  Musculoskeletal:        General: Normal range of motion.     Cervical back: Normal range of motion and neck supple. No tenderness.  Skin:    General: Skin is warm.      Findings: Lesion present.       Neurological:     Mental Status: She is alert and oriented to person, place, and time.     Motor: No weakness.     Gait: Gait normal.  Psychiatric:        Mood and Affect: Mood normal.        Behavior: Behavior normal.        Thought Content: Thought content normal.        Judgment: Judgment normal.      No results found for any visits on 03/09/20.  Assessment & Plan  Abscess of back  Encounter for immunization - Plan: Pfizer SARS-COV-2 Vaccine  Atypical mole   Very atypical appearing mole noticed on left upper back, strongly encourage her to see dermatologist, she declines referral at this time and says she will think about it. Educated patient on moles and this mole is concerning for severe atypia or melanoma. Risks versus benefits of excision by dermatology discussed and recommend excision biopsy. She verbalizes understanding and reports her dad did have melanoma. She will reach out to office should she change her mind on the highly advised and  recommended dermatology referral urgent.   Abscess of back is resolved. Return if any symptoms return at anytime.   Orders Placed This Encounter  Procedures  . Pfizer SARS-COV-2 Vaccine    Return in about 1 month (around 04/09/2020), or if symptoms worsen or fail to improve, for at any time for any worsening symptoms, Go to Emergency room/ urgent care if worse.        Marcille Buffy, [place] [phone] (phone) [phone] (fax)  [place]

## 2020-03-10 DIAGNOSIS — Z23 Encounter for immunization: Secondary | ICD-10-CM | POA: Insufficient documentation

## 2020-03-20 ENCOUNTER — Telehealth: Payer: Self-pay | Admitting: Family Medicine

## 2020-03-20 DIAGNOSIS — I700 Atherosclerosis of aorta: Secondary | ICD-10-CM

## 2020-03-20 DIAGNOSIS — E785 Hyperlipidemia, unspecified: Secondary | ICD-10-CM

## 2020-03-20 NOTE — Telephone Encounter (Signed)
Left message to call back. Ok for PEC to advise of below.  

## 2020-03-20 NOTE — Telephone Encounter (Signed)
Please advise patient it is time to recheck cholesterol levels. Have placed future orders. She needs to be fasting.

## 2020-03-21 NOTE — Telephone Encounter (Signed)
Patient called back to let nurse know that she will go and get the blood work done at the beginning of the year. Please advise

## 2020-03-21 NOTE — Telephone Encounter (Signed)
Called pt and left detailed message to go to lab on 2nd floor to get blood work. (OK to leave detailed message on VM per DPR) Call back number provided in case of questions.

## 2020-04-20 ENCOUNTER — Telehealth: Payer: Self-pay | Admitting: Family Medicine

## 2020-04-20 NOTE — Telephone Encounter (Signed)
Copied from [place] [id number]. Topic: Medicare AWV >> Apr 20, 2020  3:15 PM Cher Nakai R wrote: Reason for CRM:   Left message for patient to call back and schedule Medicare Annual Wellness Visit (AWV) in office.   If not able to come in office, please offer to do virtually.   Last AWV 09/22/2017  Please schedule at anytime with [facility] Health Advisor.  If any questions, please contact me at [phone]

## 2020-06-22 ENCOUNTER — Telehealth: Payer: Self-pay | Admitting: Family Medicine

## 2020-06-22 NOTE — Telephone Encounter (Signed)
Copied from [place] [id number]. Topic: Medicare AWV >> Jun 22, 2020 11:19 AM Cher Nakai R wrote: Reason for CRM:  Left message for patient to call back and schedule Medicare Annual Wellness Visit (AWV) in office.   If not able to come in office, please offer to do virtually or by telephone.   Last AWV:  09/22/2017  Please schedule at anytime with [facility] Health Advisor.  If any questions, please contact me at [phone]

## 2021-01-07 ENCOUNTER — Telehealth: Payer: Self-pay | Admitting: Family Medicine

## 2021-01-07 DIAGNOSIS — R918 Other nonspecific abnormal finding of lung field: Secondary | ICD-10-CM

## 2021-01-07 DIAGNOSIS — I7121 Aneurysm of the ascending aorta, without rupture: Secondary | ICD-10-CM

## 2021-01-07 NOTE — Telephone Encounter (Signed)
Please advise patient it is time for her yearly chest CT angiogram to follow up on aneurysm and lung nodules seen on CT last year. Order has been placed and she should get a call this week from scheduling department.

## 2021-01-09 NOTE — Telephone Encounter (Signed)
Tried calling patient. Left message to call back. OK for PEC triage to advise.  ?

## 2021-01-09 NOTE — Telephone Encounter (Signed)
Patient called, left VM to return the call to the office for a message from Dr. Caryn Section.

## 2021-01-10 NOTE — Addendum Note (Signed)
Addended by: Birdie Sons on: 01/10/2021 02:36 PM   Modules accepted: Orders

## 2021-01-30 ENCOUNTER — Telehealth: Payer: Self-pay | Admitting: Family Medicine

## 2021-01-30 NOTE — Telephone Encounter (Signed)
Error

## 2021-01-31 ENCOUNTER — Ambulatory Visit: Admission: RE | Admit: 2021-01-31 | Payer: PPO | Source: Ambulatory Visit

## 2021-02-06 ENCOUNTER — Telehealth: Payer: Self-pay

## 2021-02-06 NOTE — Telephone Encounter (Signed)
FYI...   Explained to pt what CT scan was ordered and why.  I review her last years results to her again.  She stated she didn't realize her scan would not be "screening" and canceled the appointment.  She states she does not want to reschedule at this time.    Thanks,   -Mickel Baas

## 2021-02-06 NOTE — Telephone Encounter (Signed)
Copied from [place] [id number]. Topic: General - Inquiry >> Feb 06, 2021  3:47 PM Oneta Rack wrote: Patient would like to know why PCP placed CT orders.

## 2021-09-06 ENCOUNTER — Telehealth: Payer: Self-pay

## 2021-09-06 NOTE — Telephone Encounter (Signed)
Pt advised and wants to hold off on referral at the moment.  Asked about whether ins would cover, would it be just a local anesthetic, etc.  Wants you to take a look at it before going ahead.  Okay to schedule office visit?

## 2021-09-06 NOTE — Telephone Encounter (Signed)
Copied from [place] [id number]. Topic: General - Other >> Sep 05, 2021  5:16 PM Loma Boston wrote: Pt has a boil on back of neck about the size of a grape for at least 10 years. She now says it has a head on it and has pus about the size of a BB pellet. She is wanting dr Caryn Section to remove? After hrs pls contact pt 6/1 at [phone].

## 2021-09-06 NOTE — Telephone Encounter (Signed)
It's a sebaceous cyst. If she wants it removed she'll need a referral to generally surgery

## 2021-09-21 ENCOUNTER — Emergency Department: Payer: PPO

## 2021-09-21 ENCOUNTER — Other Ambulatory Visit: Payer: Self-pay

## 2021-09-21 ENCOUNTER — Emergency Department
Admission: EM | Admit: 2021-09-21 | Discharge: 2021-09-21 | Disposition: A | Discharge status: Home / Self Care | Payer: PPO | Attending: Emergency Medicine | Admitting: Emergency Medicine

## 2021-09-21 ENCOUNTER — Encounter: Payer: Self-pay | Admitting: Emergency Medicine

## 2021-09-21 DIAGNOSIS — M25521 Pain in right elbow: Secondary | ICD-10-CM | POA: Diagnosis not present

## 2021-09-21 DIAGNOSIS — S62101A Fracture of unspecified carpal bone, right wrist, initial encounter for closed fracture: Secondary | ICD-10-CM

## 2021-09-21 DIAGNOSIS — S59812A Other specified injuries left forearm, initial encounter: Secondary | ICD-10-CM | POA: Diagnosis not present

## 2021-09-21 DIAGNOSIS — F17200 Nicotine dependence, unspecified, uncomplicated: Secondary | ICD-10-CM | POA: Insufficient documentation

## 2021-09-21 DIAGNOSIS — M25551 Pain in right hip: Secondary | ICD-10-CM | POA: Diagnosis not present

## 2021-09-21 DIAGNOSIS — M7989 Other specified soft tissue disorders: Secondary | ICD-10-CM | POA: Diagnosis not present

## 2021-09-21 DIAGNOSIS — S6291XA Unspecified fracture of right wrist and hand, initial encounter for closed fracture: Secondary | ICD-10-CM | POA: Diagnosis not present

## 2021-09-21 DIAGNOSIS — M542 Cervicalgia: Secondary | ICD-10-CM | POA: Diagnosis not present

## 2021-09-21 DIAGNOSIS — S52571A Other intraarticular fracture of lower end of right radius, initial encounter for closed fracture: Secondary | ICD-10-CM | POA: Insufficient documentation

## 2021-09-21 DIAGNOSIS — S29002A Unspecified injury of muscle and tendon of back wall of thorax, initial encounter: Secondary | ICD-10-CM | POA: Diagnosis present

## 2021-09-21 DIAGNOSIS — R519 Headache, unspecified: Secondary | ICD-10-CM | POA: Insufficient documentation

## 2021-09-21 DIAGNOSIS — M79642 Pain in left hand: Secondary | ICD-10-CM | POA: Diagnosis not present

## 2021-09-21 DIAGNOSIS — M25511 Pain in right shoulder: Secondary | ICD-10-CM | POA: Insufficient documentation

## 2021-09-21 DIAGNOSIS — S51812A Laceration without foreign body of left forearm, initial encounter: Secondary | ICD-10-CM | POA: Diagnosis not present

## 2021-09-21 DIAGNOSIS — S199XXA Unspecified injury of neck, initial encounter: Secondary | ICD-10-CM | POA: Diagnosis not present

## 2021-09-21 DIAGNOSIS — S2241XA Multiple fractures of ribs, right side, initial encounter for closed fracture: Secondary | ICD-10-CM | POA: Diagnosis not present

## 2021-09-21 DIAGNOSIS — S0990XA Unspecified injury of head, initial encounter: Secondary | ICD-10-CM | POA: Diagnosis not present

## 2021-09-21 DIAGNOSIS — S22060A Wedge compression fracture of T7-T8 vertebra, initial encounter for closed fracture: Secondary | ICD-10-CM

## 2021-09-21 DIAGNOSIS — M47812 Spondylosis without myelopathy or radiculopathy, cervical region: Secondary | ICD-10-CM | POA: Diagnosis not present

## 2021-09-21 DIAGNOSIS — S90511A Abrasion, right ankle, initial encounter: Secondary | ICD-10-CM | POA: Insufficient documentation

## 2021-09-21 DIAGNOSIS — Z043 Encounter for examination and observation following other accident: Secondary | ICD-10-CM | POA: Diagnosis not present

## 2021-09-21 DIAGNOSIS — S52601A Unspecified fracture of lower end of right ulna, initial encounter for closed fracture: Secondary | ICD-10-CM | POA: Diagnosis not present

## 2021-09-21 DIAGNOSIS — W108XXA Fall (on) (from) other stairs and steps, initial encounter: Secondary | ICD-10-CM | POA: Diagnosis not present

## 2021-09-21 DIAGNOSIS — W19XXXA Unspecified fall, initial encounter: Secondary | ICD-10-CM

## 2021-09-21 DIAGNOSIS — M546 Pain in thoracic spine: Secondary | ICD-10-CM | POA: Diagnosis not present

## 2021-09-21 DIAGNOSIS — S2243XA Multiple fractures of ribs, bilateral, initial encounter for closed fracture: Secondary | ICD-10-CM | POA: Diagnosis not present

## 2021-09-21 DIAGNOSIS — D689 Coagulation defect, unspecified: Secondary | ICD-10-CM | POA: Diagnosis not present

## 2021-09-21 DIAGNOSIS — S22069A Unspecified fracture of T7-T8 vertebra, initial encounter for closed fracture: Secondary | ICD-10-CM | POA: Diagnosis not present

## 2021-09-21 DIAGNOSIS — S52501A Unspecified fracture of the lower end of right radius, initial encounter for closed fracture: Secondary | ICD-10-CM | POA: Diagnosis not present

## 2021-09-21 MED ORDER — ACETAMINOPHEN 10 MG/ML IV SOLN
1000.0000 mg | INTRAVENOUS | Status: AC
  Administered 2021-09-21: 1000 mg via INTRAVENOUS
  Filled 2021-09-21: qty 100

## 2021-09-21 MED ORDER — BUPIVACAINE HCL (PF) 0.5 % IJ SOLN
50.0000 mL | Freq: Once | INTRAMUSCULAR | Status: AC
  Administered 2021-09-21: 50 mL
  Filled 2021-09-21: qty 60

## 2021-09-21 MED ORDER — OXYCODONE-ACETAMINOPHEN 5-325 MG PO TABS: 1.0000 | ORAL_TABLET | Freq: Three times a day (TID) | ORAL | 0 refills | Status: DC | PRN

## 2021-09-21 MED ORDER — FENTANYL CITRATE PF 50 MCG/ML IJ SOSY
50.0000 ug | PREFILLED_SYRINGE | Freq: Once | INTRAMUSCULAR | Status: AC
  Administered 2021-09-21: 50 ug via INTRAVENOUS
  Filled 2021-09-21: qty 1

## 2021-09-21 MED ORDER — BUPIVACAINE HCL 0.5 % IJ SOLN: 50.0000 mL | Freq: Once | INTRAMUSCULAR | Status: DC

## 2021-09-21 MED ORDER — LIDOCAINE 5 % EX PTCH
1.0000 | MEDICATED_PATCH | CUTANEOUS | Status: DC
  Filled 2021-09-21: qty 1

## 2021-09-21 MED ORDER — LIDOCAINE 5 % EX PTCH
1.0000 | MEDICATED_PATCH | CUTANEOUS | Status: DC
  Administered 2021-09-21: 1 via TRANSDERMAL

## 2021-09-21 MED ORDER — OXYCODONE HCL 5 MG PO TABS
5.0000 mg | ORAL_TABLET | ORAL | Status: AC
  Administered 2021-09-21: 5 mg via ORAL
  Filled 2021-09-21: qty 1

## 2021-09-21 NOTE — ED Notes (Signed)
This RN first encounter with pt prior to discharge. Pt verbalized understanding of discharge instructions, prescriptions, and follow-up care instructions.

## 2021-09-21 NOTE — ED Provider Notes (Signed)
[facility] Provider Note    Event Date/Time   First MD Initiated Contact with Patient 09/21/21 1237     (approximate)   History   Fall   HPI  Alexis Hart is a 71 y.o. female with past medical history of hemorrhoids and tobacco abuse as well as a known aortic aneurysm with patient reportedly on review of records refusing any screening exams he presents for evaluation after a fall.  Patient states he fell into a wall down some steps and garage.  She denies any preceding symptoms such as lightheadedness, dizziness, chest pain nausea, visual changes or any other preceding sick symptoms and states she thinks she just lost her balance.  She did hit her head but did not have LOC.  She is not on any blood thinners.  He is complaining of headache, neck pain, mid back pain, right shoulder pain, right elbow pain, right wrist pain, left forearm pain, left thumb pain as well as right hip pain.  No other recent falls or injuries or sick symptoms such as nausea, vomiting, diarrhea, chest pain, cough, shortness of breath or other recent falls.  No other acute concerns at this time.  No analgesia prior to arrival.    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      Excl. in [place]?     Most recent vital signs: Vitals:   09/21/21 1238  BP: (!) 153/80  Pulse: 75  Resp: 18  Temp: 98.3 F (36.8 C)  SpO2: 99%    General: Awake, very uncomfortable appearing. CV:  Good peripheral perfusion.  2+ bilateral radial  pulses. Resp:  Normal effort.  Clear bilaterally. Abd:  No distention.  Soft. Other:  Some very mild C-spine and upper T-spine tenderness with bilateral tenderness over the trapezius muscles.  No clear point tenderness over the scalp and cranial nerves II through XII are grossly intact.  No other obvious trauma to the face or neck.  No L-spine tenderness.  Patient does have some pain over the lateral aspect the right hip and decreased range of motion.  There is a very small abrasion over the anterior right ankle but otherwise patient has no pain or decreased range of motion or sensation lower extremities.  There is some tenderness over the right chest.  In the left upper extremity she has a 2 cm very superficial skin tear over the dorsal aspect of the left forearm without any significant surrounding tenderness.  Sensation is intact in the distribution of the radial ulnar and median nerves in the bile upper extremities.  Patient does have some mild tenderness of the interphalangeal joint of the left thumb but no snuffbox tenderness or  any other digit tenderness.  There are no effusions deformities or other evidence of trauma to the left forearm wrist or hand.  In the right upper extremity patient has decreased range of motion and tenderness throughout the right shoulder, posterior aspect of right elbow and there is deformity at the right wrist.  Sensation is intact distally and patient has less than 2-second capillary refill.  There is no skin puncture wounds.   ED Results / Procedures / Treatments  Labs (all labs ordered are listed, but only abnormal results are displayed) Labs Reviewed - No data to display   EKG   RADIOLOGY  CT head and C-spine on my interpretation without evidence of a skull fracture, intracranial hemorrhage, edema, mass effect or acute C-spine injury.  X-ray of the left forearm, left hand, right shoulder right hip, and right elbow and my interpretation without fracture or  dislocation.  I also reviewed radiology interpretation and agree with their findings.  X-ray of the T-spine shows apparent age-indeterminate T8 compression fracture without any other clear fracture or dislocation.  I also agree with radiology's interpretation.  X-ray of the right wrist shows comminuted dorsally displaced intra-articular fracture of the distal radius associate with distal tilt without evidence of ulnar fracture.  Also reviewed radiology interpretation.  X-ray right rib series on my interpretation shows what appears to be a right seventh rib fracture.  I also viewed radiology dictation and agree with the findings of possibly additional 6 rib fracture but no displaced rib fractures pneumothorax or effusion.  Postreduction x-ray of the right wrist on my interpretation shows some improvement but some persistent with persistent dorsal displacement of the distal radial fracture.  I also reviewed radiology's interpretation   PROCEDURES:  Critical Care performed: No  Reduction of fracture  Date/Time: 09/21/2021 3:49 PM  Performed by: Lucrezia Starch, MD Authorized by: Lucrezia Starch, MD  Consent: Verbal consent obtained. Consent given by: patient Local anesthesia used: yes Anesthesia: hematoma block  Anesthesia: Local anesthesia used: yes  Sedation: Patient sedated: no  Patient tolerance: patient tolerated the procedure well with no immediate complications      MEDICATIONS ORDERED IN ED: Medications  lidocaine (LIDODERM) 5 % 1 patch (1 patch Transdermal Not Given 09/21/21 1748)  lidocaine (LIDODERM) 5 % 1 patch (1 patch Transdermal Patch Applied 09/21/21 1630)  oxyCODONE (Oxy IR/ROXICODONE) immediate release tablet 5 mg (has no administration in time range)  fentaNYL (SUBLIMAZE) injection 50 mcg (50 mcg Intravenous Given 09/21/21 1434)  acetaminophen (OFIRMEV) IV 1,000 mg (0 mg Intravenous Stopped 09/21/21 1642)  bupivacaine(PF) (MARCAINE) 0.5 % injection 50 mL (50  mLs Infiltration Given by Other 09/21/21 1449)  fentaNYL (SUBLIMAZE) injection 50 mcg (50 mcg Intravenous Given 09/21/21 1537)     IMPRESSION / MDM / ASSESSMENT AND PLAN / ED COURSE  I reviewed the triage vital signs and the nursing notes. Patient's presentation is most consistent with acute presentation with potential threat to life or bodily function.                               Differential diagnosis includes, but is not limited to skull fracture, intracranial hemorrhage, concussion, C-spine fracture or traumatic listhesis, muscle sprain as well as possible T-spine fracture or muscle sprain.  In addition in the left upper extremity she has evidence of a superficial skin tear does have some pain at the left thumb is possible this is a contusion versus a  occult fracture.  She does not suggesting tenderness around the skin tear in the left upper extremity she is also complaining of severe pain in the right upper extremity and this could represent a distracting injury and so obtain a plain film of the left forearm as well.  In the right upper extremity she has fairly decreased range of motion throughout but is neurovascular intact.  There is deformity of the right wrist with an obvious apparent closed fracture and concern for possible fracture wrist contusion or other MSK injury at the shoulder and elbow as well.  Patient states she just lost her balance going down stairs without any preceding symptoms or LOC which sounds very mechanical and have a low suspicion for seizure or syncope preceding the fall.  CT head and C-spine on my interpretation without evidence of a skull fracture, intracranial hemorrhage, edema, mass effect or acute C-spine injury.  X-ray of the left forearm, left hand, right shoulder right hip, and right elbow and my interpretation without fracture or dislocation.  I also reviewed radiology interpretation and agree with their findings.  X-ray of the T-spine shows apparent  age-indeterminate T8 compression fracture without any other clear fracture or dislocation.  I also agree with radiology's interpretation.  X-ray of the right wrist shows comminuted dorsally displaced intra-articular fracture of the distal radius associate with distal tilt without evidence of ulnar fracture.  Also reviewed radiology interpretation.  X-ray right rib series on my interpretation shows what appears to be a right seventh rib fracture.  I also viewed radiology dictation and agree with the findings of possibly additional 6 rib fracture but no displaced rib fractures pneumothorax or effusion  Postreduction x-ray of the right wrist on my interpretation shows some improvement but some persistent with persistent dorsal displacement of the distal radial fracture.  I also reviewed radiology's interpretation.   Given some persistent dislocation and overall severity of injury I did reach out to on-call orthopedist Dr. Sharlet Salina reviewed imaging and felt that he was uncomfortable managing the fracture but facilitated discussion with hand specialist at [facility] Dr. Caralyn Guile who will see patient in preop tomorrow morning with plan for ambulatory repair of the wrist.  I explained to the patient and she is agreeable to plan.  I also reached out to on-call neurosurgeon Dr. Cari Caraway with regards to her what I suspect is an acute T8 compression fracture and he recommended no bracing at this time but that patient could follow-up in her clinic for persistent pain.  Explained to patient she is agreeable this.  Rx written for analgesia.  Emphasized importance of close follow-up with her PCP as well and to maintain n.p.o. status after midnight and go to [facility] to the preop area tomorrow morning.      FINAL CLINICAL IMPRESSION(S) / ED DIAGNOSES   Final diagnoses:  Fall, initial encounter  Closed fracture of right wrist, initial encounter  ISTAP type 1 skin tear of left forearm  Closed fracture of  multiple ribs of right side, initial encounter  Closed wedge compression fracture of T8 vertebra, initial encounter ([place])     Rx / DC Orders   ED Discharge Orders          Ordered    oxyCODONE-acetaminophen (PERCOCET) 5-325 MG tablet  Every 8 hours PRN        09/21/21 1857             Note:  This document was prepared using Dragon voice recognition software and may include  unintentional dictation errors.   Lucrezia Starch, MD 09/21/21 1900

## 2021-09-21 NOTE — ED Notes (Signed)
Called [facility] for pending orthopedic consult. [facility] stated they have paged it out we are just waiting for a call back

## 2021-09-21 NOTE — ED Triage Notes (Signed)
Pt here after falling down the stairs in the basement. Pt c/o right side pain, wrist and shoulder. Pt states she hit her head, denies blood thinners. Pt has visible injury to wrist.

## 2021-09-22 ENCOUNTER — Inpatient Hospital Stay (HOSPITAL_COMMUNITY): Payer: PPO

## 2021-09-22 ENCOUNTER — Ambulatory Visit (HOSPITAL_COMMUNITY)
Admission: RE | Admit: 2021-09-22 | Discharge: 2021-09-22 | Disposition: A | Discharge status: Home / Self Care | Payer: PPO | Source: Other Acute Inpatient Hospital | Attending: Orthopedic Surgery | Admitting: Orthopedic Surgery

## 2021-09-22 ENCOUNTER — Inpatient Hospital Stay (HOSPITAL_COMMUNITY): Payer: PPO | Admitting: Certified Registered Nurse Anesthetist

## 2021-09-22 ENCOUNTER — Other Ambulatory Visit: Payer: Self-pay

## 2021-09-22 ENCOUNTER — Inpatient Hospital Stay (HOSPITAL_BASED_OUTPATIENT_CLINIC_OR_DEPARTMENT_OTHER): Payer: PPO | Admitting: Certified Registered Nurse Anesthetist

## 2021-09-22 ENCOUNTER — Encounter (HOSPITAL_COMMUNITY)
Admission: RE | Disposition: A | Discharge status: Home / Self Care | Payer: Self-pay | Source: Other Acute Inpatient Hospital | Attending: Orthopedic Surgery

## 2021-09-22 ENCOUNTER — Encounter (HOSPITAL_COMMUNITY): Payer: Self-pay | Admitting: Orthopedic Surgery

## 2021-09-22 DIAGNOSIS — S52501A Unspecified fracture of the lower end of right radius, initial encounter for closed fracture: Secondary | ICD-10-CM

## 2021-09-22 DIAGNOSIS — S52601A Unspecified fracture of lower end of right ulna, initial encounter for closed fracture: Secondary | ICD-10-CM | POA: Insufficient documentation

## 2021-09-22 DIAGNOSIS — S52571A Other intraarticular fracture of lower end of right radius, initial encounter for closed fracture: Secondary | ICD-10-CM | POA: Insufficient documentation

## 2021-09-22 DIAGNOSIS — J449 Chronic obstructive pulmonary disease, unspecified: Secondary | ICD-10-CM | POA: Insufficient documentation

## 2021-09-22 DIAGNOSIS — S52691A Other fracture of lower end of right ulna, initial encounter for closed fracture: Secondary | ICD-10-CM | POA: Diagnosis not present

## 2021-09-22 DIAGNOSIS — I2510 Atherosclerotic heart disease of native coronary artery without angina pectoris: Secondary | ICD-10-CM | POA: Insufficient documentation

## 2021-09-22 DIAGNOSIS — I739 Peripheral vascular disease, unspecified: Secondary | ICD-10-CM | POA: Diagnosis not present

## 2021-09-22 DIAGNOSIS — W19XXXA Unspecified fall, initial encounter: Secondary | ICD-10-CM | POA: Insufficient documentation

## 2021-09-22 DIAGNOSIS — Z87891 Personal history of nicotine dependence: Secondary | ICD-10-CM | POA: Insufficient documentation

## 2021-09-22 DIAGNOSIS — M85821 Other specified disorders of bone density and structure, right upper arm: Secondary | ICD-10-CM

## 2021-09-22 DIAGNOSIS — F419 Anxiety disorder, unspecified: Secondary | ICD-10-CM

## 2021-09-22 HISTORY — PX: OPEN REDUCTION INTERNAL FIXATION (ORIF) DISTAL RADIAL FRACTURE: SHX5989

## 2021-09-22 HISTORY — PX: CARPAL TUNNEL RELEASE: SHX101

## 2021-09-22 LAB — BASIC METABOLIC PANEL
Anion gap: 13 (ref 5–15)
BUN: 13 mg/dL (ref 8–23)
CO2: 22 mmol/L (ref 22–32)
Calcium: 8.9 mg/dL (ref 8.9–10.3)
Chloride: 102 mmol/L (ref 98–111)
Creatinine, Ser: 0.89 mg/dL (ref 0.44–1.00)
GFR, Estimated: >60 mL/min (ref >60)
Glucose, Bld: 134 mg/dL — ABNORMAL HIGH (ref 70–99)
Potassium: 5.3 mmol/L — ABNORMAL HIGH (ref 3.5–5.1)
Sodium: 137 mmol/L (ref 135–145)

## 2021-09-22 LAB — CBC
HCT: 37.1 % (ref 36.0–46.0)
Hemoglobin: 12 g/dL (ref 12.0–15.0)
MCH: 29.1 pg (ref 26.0–34.0)
MCHC: 32.3 g/dL (ref 30.0–36.0)
MCV: 90 fL (ref 80.0–100.0)
Platelets: 304 10*3/uL (ref 150–400)
RBC: 4.12 MIL/uL (ref 3.87–5.11)
RDW: 13.7 % (ref 11.5–15.5)
WBC: 11.5 10*3/uL — ABNORMAL HIGH (ref 4.0–10.5)
nRBC: 0 % (ref 0.0–0.2)

## 2021-09-22 LAB — SURGICAL PCR SCREEN
MRSA, PCR: NEGATIVE
Staphylococcus aureus: NEGATIVE

## 2021-09-22 SURGERY — OPEN REDUCTION INTERNAL FIXATION (ORIF) DISTAL RADIUS FRACTURE
Anesthesia: Monitor Anesthesia Care | Site: Wrist | Laterality: Right

## 2021-09-22 MED ORDER — FENTANYL CITRATE (PF) 100 MCG/2ML IJ SOLN: 100.0000 ug | Freq: Once | INTRAMUSCULAR | Status: DC

## 2021-09-22 MED ORDER — OXYCODONE-ACETAMINOPHEN 10-325 MG PO TABS: 1.0000 | ORAL_TABLET | Freq: Four times a day (QID) | ORAL | 0 refills | Status: AC | PRN

## 2021-09-22 MED ORDER — FENTANYL CITRATE (PF) 100 MCG/2ML IJ SOLN: 25.0000 ug | INTRAMUSCULAR | Status: DC | PRN

## 2021-09-22 MED ORDER — 0.9 % SODIUM CHLORIDE (POUR BTL) OPTIME
TOPICAL | Status: DC | PRN
  Administered 2021-09-22: 1000 mL

## 2021-09-22 MED ORDER — PROPOFOL 500 MG/50ML IV EMUL
INTRAVENOUS | Status: DC | PRN
  Administered 2021-09-22: 75 ug/kg/min via INTRAVENOUS

## 2021-09-22 MED ORDER — ACETAMINOPHEN 500 MG PO TABS: 1000.0000 mg | ORAL_TABLET | Freq: Once | ORAL | Status: DC | PRN

## 2021-09-22 MED ORDER — FENTANYL CITRATE (PF) 100 MCG/2ML IJ SOLN: 100.0000 ug | Freq: Once | INTRAMUSCULAR | Status: AC

## 2021-09-22 MED ORDER — LIDOCAINE 2% (20 MG/ML) 5 ML SYRINGE
INTRAMUSCULAR | Status: DC | PRN
  Administered 2021-09-22: 40 mg via INTRAVENOUS
  Administered 2021-09-22: 20 mg via INTRAVENOUS

## 2021-09-22 MED ORDER — PHENYLEPHRINE 80 MCG/ML (10ML) SYRINGE FOR IV PUSH (FOR BLOOD PRESSURE SUPPORT)
PREFILLED_SYRINGE | INTRAVENOUS | Status: AC
  Filled 2021-09-22: qty 10

## 2021-09-22 MED ORDER — ACETAMINOPHEN 10 MG/ML IV SOLN: 1000.0000 mg | Freq: Once | INTRAVENOUS | Status: DC | PRN

## 2021-09-22 MED ORDER — MIDAZOLAM HCL 2 MG/2ML IJ SOLN: 2.0000 mg | Freq: Once | INTRAMUSCULAR | Status: AC

## 2021-09-22 MED ORDER — ACETAMINOPHEN 160 MG/5ML PO SOLN: 1000.0000 mg | Freq: Once | ORAL | Status: DC | PRN

## 2021-09-22 MED ORDER — SUCCINYLCHOLINE CHLORIDE 200 MG/10ML IV SOSY
PREFILLED_SYRINGE | INTRAVENOUS | Status: AC
  Filled 2021-09-22: qty 10

## 2021-09-22 MED ORDER — LACTATED RINGERS IV SOLN: INTRAVENOUS | Status: DC

## 2021-09-22 MED ORDER — PROPOFOL 1000 MG/100ML IV EMUL
INTRAVENOUS | Status: AC
  Filled 2021-09-22: qty 100

## 2021-09-22 MED ORDER — FENTANYL CITRATE (PF) 100 MCG/2ML IJ SOLN
INTRAMUSCULAR | Status: AC
  Administered 2021-09-22: 100 ug via INTRAVENOUS
  Filled 2021-09-22: qty 2

## 2021-09-22 MED ORDER — BUPIVACAINE-EPINEPHRINE (PF) 0.5% -1:200000 IJ SOLN
INTRAMUSCULAR | Status: DC | PRN
  Administered 2021-09-22: 25 mL via PERINEURAL

## 2021-09-22 MED ORDER — PROPOFOL 10 MG/ML IV BOLUS
INTRAVENOUS | Status: AC
  Filled 2021-09-22: qty 20

## 2021-09-22 MED ORDER — CHLORHEXIDINE GLUCONATE 0.12 % MT SOLN
OROMUCOSAL | Status: AC
  Administered 2021-09-22: 15 mL via OROMUCOSAL
  Filled 2021-09-22: qty 15

## 2021-09-22 MED ORDER — LIDOCAINE HCL (PF) 2 % IJ SOLN
INTRAMUSCULAR | Status: DC | PRN
  Administered 2021-09-22: 100 mg via PERINEURAL

## 2021-09-22 MED ORDER — MIDAZOLAM HCL 2 MG/2ML IJ SOLN
INTRAMUSCULAR | Status: AC
  Administered 2021-09-22: 2 mg via INTRAVENOUS
  Filled 2021-09-22: qty 2

## 2021-09-22 MED ORDER — CHLORHEXIDINE GLUCONATE 0.12 % MT SOLN: 15.0000 mL | Freq: Once | OROMUCOSAL | Status: AC

## 2021-09-22 MED ORDER — ORAL CARE MOUTH RINSE: 15.0000 mL | Freq: Once | OROMUCOSAL | Status: AC

## 2021-09-22 MED ORDER — FENTANYL CITRATE (PF) 250 MCG/5ML IJ SOLN
INTRAMUSCULAR | Status: AC
  Filled 2021-09-22: qty 5

## 2021-09-22 MED ORDER — CEFAZOLIN SODIUM-DEXTROSE 2-4 GM/100ML-% IV SOLN
2.0000 g | INTRAVENOUS | Status: AC
  Administered 2021-09-22: 2 g via INTRAVENOUS
  Filled 2021-09-22: qty 100

## 2021-09-22 MED ORDER — ONDANSETRON HCL 4 MG/2ML IJ SOLN
INTRAMUSCULAR | Status: DC | PRN
  Administered 2021-09-22: 4 mg via INTRAVENOUS

## 2021-09-22 MED ORDER — BUPIVACAINE HCL (PF) 0.25 % IJ SOLN
INTRAMUSCULAR | Status: AC
  Filled 2021-09-22: qty 30

## 2021-09-22 SURGICAL SUPPLY — 72 items
BAG COUNTER SPONGE SURGICOUNT (BAG) ×2 IMPLANT
BIT DRILL 2.2 SS TIBIAL (BIT) ×2 IMPLANT
BLADE CLIPPER SURG (BLADE) IMPLANT
BNDG ELASTIC 3X5.8 VLCR NS LF (GAUZE/BANDAGES/DRESSINGS) ×1 IMPLANT
BNDG ELASTIC 3X5.8 VLCR STR LF (GAUZE/BANDAGES/DRESSINGS) ×2 IMPLANT
BNDG ELASTIC 4X5.8 VLCR NS LF (GAUZE/BANDAGES/DRESSINGS) ×1 IMPLANT
BNDG ELASTIC 4X5.8 VLCR STR LF (GAUZE/BANDAGES/DRESSINGS) ×2 IMPLANT
BNDG ESMARK 4X9 LF (GAUZE/BANDAGES/DRESSINGS) ×2 IMPLANT
BNDG GAUZE DERMACEA FLUFF (GAUZE/BANDAGES/DRESSINGS) ×1
BNDG GAUZE DERMACEA FLUFF 4 (GAUZE/BANDAGES/DRESSINGS) IMPLANT
BNDG GAUZE ELAST 4 BULKY (GAUZE/BANDAGES/DRESSINGS) ×2 IMPLANT
CANISTER SUCT 3000ML PPV (MISCELLANEOUS) ×2 IMPLANT
CORD BIPOLAR FORCEPS 12FT (ELECTRODE) ×2 IMPLANT
COVER SURGICAL LIGHT HANDLE (MISCELLANEOUS) ×2 IMPLANT
CUFF TOURN SGL QUICK 18X4 (TOURNIQUET CUFF) ×2 IMPLANT
CUFF TOURN SGL QUICK 24 (TOURNIQUET CUFF)
CUFF TRNQT CYL 24X4X16.5-23 (TOURNIQUET CUFF) IMPLANT
DRAPE OEC MINIVIEW 54X84 (DRAPES) ×2 IMPLANT
DRAPE SURG 17X11 SM STRL (DRAPES) ×2 IMPLANT
DRSG ADAPTIC 3X8 NADH LF (GAUZE/BANDAGES/DRESSINGS) ×2 IMPLANT
GAUZE 4X4 16PLY ~~LOC~~+RFID DBL (SPONGE) ×2 IMPLANT
GAUZE SPONGE 4X4 12PLY STRL (GAUZE/BANDAGES/DRESSINGS) ×2 IMPLANT
GAUZE XEROFORM 1X8 LF (GAUZE/BANDAGES/DRESSINGS) ×1 IMPLANT
GAUZE XEROFORM 5X9 LF (GAUZE/BANDAGES/DRESSINGS) ×2 IMPLANT
GLOVE BIOGEL PI IND STRL 8.5 (GLOVE) ×1 IMPLANT
GLOVE BIOGEL PI INDICATOR 8.5 (GLOVE) ×1
GLOVE SURG ORTHO 8.0 STRL STRW (GLOVE) ×2 IMPLANT
GOWN STRL REUS W/ TWL LRG LVL3 (GOWN DISPOSABLE) ×1 IMPLANT
GOWN STRL REUS W/ TWL XL LVL3 (GOWN DISPOSABLE) ×1 IMPLANT
GOWN STRL REUS W/TWL LRG LVL3 (GOWN DISPOSABLE) ×1
GOWN STRL REUS W/TWL XL LVL3 (GOWN DISPOSABLE) ×1
K-WIRE 1.6 (WIRE) ×2
K-WIRE FX5X1.6XNS BN SS (WIRE) ×2
KIT BASIN OR (CUSTOM PROCEDURE TRAY) ×2 IMPLANT
KIT TURNOVER KIT B (KITS) ×2 IMPLANT
KWIRE FX5X1.6XNS BN SS (WIRE) IMPLANT
NDL HYPO 25X1 1.5 SAFETY (NEEDLE) ×1 IMPLANT
NEEDLE HYPO 25X1 1.5 SAFETY (NEEDLE) ×2 IMPLANT
NS IRRIG 1000ML POUR BTL (IV SOLUTION) ×2 IMPLANT
PACK ORTHO EXTREMITY (CUSTOM PROCEDURE TRAY) ×2 IMPLANT
PAD ARMBOARD 7.5X6 YLW CONV (MISCELLANEOUS) ×4 IMPLANT
PAD CAST 3X4 CTTN HI CHSV (CAST SUPPLIES) IMPLANT
PAD CAST 4YDX4 CTTN HI CHSV (CAST SUPPLIES) ×1 IMPLANT
PADDING CAST COTTON 3X4 STRL (CAST SUPPLIES) ×1
PADDING CAST COTTON 4X4 STRL (CAST SUPPLIES) ×1
PLATE DVR CROSSLOCK 180 WRIST (Plate) ×1 IMPLANT
PUTTY DBM STAGRAFT PLUS 2CC (Putty) ×2 IMPLANT
SCREW 2.7X12MM (Screw) ×1 IMPLANT
SCREW LOCK 12X2.7X 3 LD (Screw) IMPLANT
SCREW LOCK 14X2.7X 3 LD TPR (Screw) IMPLANT
SCREW LOCKING 2.7X12MM (Screw) ×3 IMPLANT
SCREW LOCKING 2.7X14 (Screw) ×3 IMPLANT
SOAP 2 % CHG 4 OZ (WOUND CARE) ×2 IMPLANT
SPIKE FLUID TRANSFER (MISCELLANEOUS) ×2 IMPLANT
SPLINT FIBERGLASS 3X35 (CAST SUPPLIES) ×1 IMPLANT
SPONGE T-LAP 4X18 ~~LOC~~+RFID (SPONGE) ×2 IMPLANT
SUT MAXBRAID #2 CVD NDL (SUTURE) ×1 IMPLANT
SUT MNCRL AB 3-0 PS2 18 (SUTURE) ×2 IMPLANT
SUT PROLENE 4 0 PS 2 18 (SUTURE) ×5 IMPLANT
SUT VIC AB 0 CT1 27 (SUTURE) ×1
SUT VIC AB 0 CT1 27XBRD ANBCTR (SUTURE) IMPLANT
SUT VIC AB 2-0 CT1 27 (SUTURE)
SUT VIC AB 2-0 CT1 TAPERPNT 27 (SUTURE) IMPLANT
SUT VIC AB 2-0 CT2 27 (SUTURE) ×3 IMPLANT
SUT VICRYL 4-0 PS2 18IN ABS (SUTURE) IMPLANT
SYR CONTROL 10ML LL (SYRINGE) IMPLANT
TAPE UMBILICAL 1/8 X36 TWILL (MISCELLANEOUS) ×1 IMPLANT
TOWEL GREEN STERILE (TOWEL DISPOSABLE) ×2 IMPLANT
TOWEL GREEN STERILE FF (TOWEL DISPOSABLE) IMPLANT
TUBE CONNECTING 12X1/4 (SUCTIONS) ×2 IMPLANT
WATER STERILE IRR 1000ML POUR (IV SOLUTION) ×2 IMPLANT
YANKAUER SUCT BULB TIP NO VENT (SUCTIONS) IMPLANT

## 2021-09-22 NOTE — H&P (Signed)
Alexis Hart is an 71 y.o. female.   Chief Complaint: Left distal radius fracture HPI: Patient is a right-hand-dominant female who fell coming out of the garage landing on her outstretched right wrist.  Patient seen and evaluated at [place].  The staff at [facility] did not feel comfortable taking care of the wrist injury and requested the patient be taken care of at [facility].  Patient is here for definitive surgery on the right wrist.  No prior injury to the right wrist.  Past Medical History:  Diagnosis Date   Compulsive tobacco user syndrome 02/20/2005   > 30 pack year history    Hemorrhoids    Tubal pregnancy 1977    Past Surgical History:  Procedure Laterality Date   ABDOMINAL HYSTERECTOMY  04/09/1975   Total-due to ectopic pregnancy   APPENDECTOMY  04/09/1975   TONSILLECTOMY      Family History  Problem Relation Age of Onset   Ovarian cancer Mother    Melanoma Father    Diabetes Father    Heart disease Father    Lupus Sister    Lupus Paternal Grandfather    Heart attack Paternal Grandmother    Social History:  reports that she quit smoking about 4 years ago. Her smoking use included cigarettes. She started smoking about 6 years ago. She has a 42.00 pack-year smoking history. She has never used smokeless tobacco. She reports that she does not drink alcohol and does not use drugs.  Allergies:  Allergies  Allergen Reactions   Colestid [Colestipol Hcl]     Unknown reaction   Cozaar [Losartan] Rash and Other (See Comments)    Brittle nails Hair loss   Crestor [Rosuvastatin] Rash   Latex Rash   Lipitor [Atorvastatin] Rash and Other (See Comments)    Brittle nails Hair loss   Pravachol [Pravastatin] Rash and Other (See Comments)    Fatigue Brittle nails Hair loss    Medications Prior to Admission  Medication Sig Dispense Refill   acetaminophen (TYLENOL) 500 MG tablet Take 1,000 mg by mouth every 6 (six) hours as needed for mild pain.     CALCIUM PO Take 1  tablet by mouth daily.     lidocaine (LIDODERM) 5 % Place 1 patch onto the skin daily.     MAGNESIUM PO Take 1 tablet by mouth daily.     Multiple Vitamins-Minerals (ZINC PO) Take 1 tablet by mouth daily.     Prenatal Vit-Fe Fumarate-FA (PRENATAL PO) Take 1 tablet by mouth daily.     Pyridoxine HCl (B-6 PO) Take 1 tablet by mouth daily.     oxyCODONE-acetaminophen (PERCOCET) 5-325 MG tablet Take 1 tablet by mouth every 8 (eight) hours as needed for up to 5 days for severe pain. (Patient not taking: Reported on 09/22/2021) 15 tablet 0   Vitamin D, Ergocalciferol, (DRISDOL) 50000 units CAPS capsule TAKE 1 CAPSULE BY MOUTH EVERY WEEK (Patient not taking: Reported on 09/22/2021) 12 capsule 4    Results for orders placed or performed during the hospital encounter of 09/22/21 (from the past 48 hour(s))  Basic metabolic panel     Status: Abnormal   Collection Time: 09/22/21  8:10 AM  Result Value Ref Range   Sodium 137 135 - 145 mmol/L   Potassium 5.3 (H) 3.5 - 5.1 mmol/L   Chloride 102 98 - 111 mmol/L   CO2 22 22 - 32 mmol/L   Glucose, Bld 134 (H) 70 - 99 mg/dL    Comment: Glucose reference  range applies only to samples taken after fasting for at least 8 hours.   BUN 13 8 - 23 mg/dL   Creatinine, Ser 0.89 0.44 - 1.00 mg/dL   Calcium 8.9 8.9 - 10.3 mg/dL   GFR, Estimated >60 >60 mL/min    Comment: (NOTE) Calculated using the CKD-EPI Creatinine Equation (2021)    Anion gap 13 5 - 15    Comment: Performed at [facility] [address], [place], [place] [address]  CBC     Status: Abnormal   Collection Time: 09/22/21  8:10 AM  Result Value Ref Range   WBC 11.5 (H) 4.0 - 10.5 K/uL   RBC 4.12 3.87 - 5.11 MIL/uL   Hemoglobin 12.0 12.0 - 15.0 g/dL   HCT 37.1 36.0 - 46.0 %   MCV 90.0 80.0 - 100.0 fL   MCH 29.1 26.0 - 34.0 pg   MCHC 32.3 30.0 - 36.0 g/dL   RDW 13.7 11.5 - 15.5 %   Platelets 304 150 - 400 K/uL   nRBC 0.0 0.0 - 0.2 %    Comment: Performed at [facility], [place] [address], [place], [place] [address]   DG Wrist Complete Right  Result Date: 09/21/2021 CLINICAL DATA:  Right wrist fracture status post reduction EXAM: RIGHT WRIST - COMPLETE 3+ VIEW COMPARISON:  Same day radiographs FINDINGS: Interval reduction and splinting of distal radial and ulnar fractures. Slight interval improvement in the fracture alignment, however distal radial fracture remains dorsally displaced by approximately 1 shaft width. Radiocarpal joint alignment appears maintained. IMPRESSION: Interval reduction and splinting of distal radial and ulnar fractures with persistent dorsal displacement of the distal radial fracture. Electronically Signed   By: Davina Poke D.O.   On: 09/21/2021 16:43   DG Ribs Unilateral W/Chest Right  Result Date: 09/21/2021 CLINICAL DATA:  Right rib pain after a fall down stairs today. EXAM: RIGHT RIBS AND CHEST - 3+ VIEW COMPARISON:  Chest CT 12/16/2019 and chest radiograph 02/27/2019 FINDINGS: The cardiomediastinal silhouette is unchanged with normal heart size. There is mild chronic coarsening of the interstitial markings. No airspace consolidation, edema, sizable pleural effusion, or pneumothorax is identified. There is a nondisplaced fracture of the right seventh rib laterally, and a nondisplaced fracture is also questioned of the lateral right sixth rib. IMPRESSION: Nondisplaced fractures of the right seventh and possibly sixth ribs. No pneumothorax. Electronically Signed   By: Logan Bores M.D.   On: 09/21/2021 15:15   CT HEAD WO CONTRAST (5MM)  Result Date: 09/21/2021 CLINICAL DATA:  Head trauma, coagulopathy (Age 13-64y); Neck trauma (Age >= 65y) EXAM: CT HEAD WITHOUT CONTRAST CT CERVICAL SPINE WITHOUT CONTRAST TECHNIQUE: Multidetector CT imaging of the head and cervical spine was performed following the standard protocol without intravenous contrast. Multiplanar CT image reconstructions of the cervical spine were also generated. RADIATION DOSE REDUCTION: This  exam was performed according to the departmental dose-optimization program which includes automated exposure control, adjustment of the mA and/or kV according to patient size and/or use of iterative reconstruction technique. COMPARISON:  None Available. FINDINGS: CT HEAD FINDINGS Brain: No evidence of acute infarction, hemorrhage, hydrocephalus, extra-axial collection or mass lesion/mass effect. Vascular: No hyperdense vessel identified. Skull: No acute fracture. Sinuses/Orbits: Largely clear sinuses.  No acute orbital findings. Other: No mastoid effusions. CT CERVICAL SPINE FINDINGS Alignment: Straightening.  No substantial sagittal subluxation. Skull base and vertebrae: Vertebral body heights are maintained. No evidence of acute fracture. Soft tissues and spinal canal: No prevertebral fluid  or swelling. No visible canal hematoma. Disc levels: Multilevel degenerative change, greatest and moderate at C5-C6 with disc height loss and endplate spurring. Upper chest: Visualized lung apices are clear. IMPRESSION: 1. No evidence of acute intracranial abnormality. 2. No evidence of acute fracture or traumatic malalignment in the cervical spine. Electronically Signed   By: Margaretha Sheffield M.D.   On: 09/21/2021 14:08   CT Cervical Spine Wo Contrast  Result Date: 09/21/2021 CLINICAL DATA:  Head trauma, coagulopathy (Age 24-64y); Neck trauma (Age >= 65y) EXAM: CT HEAD WITHOUT CONTRAST CT CERVICAL SPINE WITHOUT CONTRAST TECHNIQUE: Multidetector CT imaging of the head and cervical spine was performed following the standard protocol without intravenous contrast. Multiplanar CT image reconstructions of the cervical spine were also generated. RADIATION DOSE REDUCTION: This exam was performed according to the departmental dose-optimization program which includes automated exposure control, adjustment of the mA and/or kV according to patient size and/or use of iterative reconstruction technique. COMPARISON:  None Available.  FINDINGS: CT HEAD FINDINGS Brain: No evidence of acute infarction, hemorrhage, hydrocephalus, extra-axial collection or mass lesion/mass effect. Vascular: No hyperdense vessel identified. Skull: No acute fracture. Sinuses/Orbits: Largely clear sinuses.  No acute orbital findings. Other: No mastoid effusions. CT CERVICAL SPINE FINDINGS Alignment: Straightening.  No substantial sagittal subluxation. Skull base and vertebrae: Vertebral body heights are maintained. No evidence of acute fracture. Soft tissues and spinal canal: No prevertebral fluid or swelling. No visible canal hematoma. Disc levels: Multilevel degenerative change, greatest and moderate at C5-C6 with disc height loss and endplate spurring. Upper chest: Visualized lung apices are clear. IMPRESSION: 1. No evidence of acute intracranial abnormality. 2. No evidence of acute fracture or traumatic malalignment in the cervical spine. Electronically Signed   By: Margaretha Sheffield M.D.   On: 09/21/2021 14:08   DG Forearm Left  Result Date: 09/21/2021 CLINICAL DATA:  Status post fall EXAM: LEFT FOREARM - 2 VIEW COMPARISON:  None Available. FINDINGS: No acute fracture or dislocation. No aggressive osseous lesion. Normal alignment. Generalized osteopenia. Soft tissue are unremarkable. No radiopaque foreign body or soft tissue emphysema. IMPRESSION: 1. No acute osseous injury of the left forearm. Electronically Signed   By: Kathreen Devoid M.D.   On: 09/21/2021 14:05   DG Thoracic Spine 2 View  Result Date: 09/21/2021 CLINICAL DATA:  Status post fall, back pain EXAM: THORACIC SPINE 2 VIEWS COMPARISON:  12/16/2019 CT chest FINDINGS: Mild T8 vertebral body compression fracture of indeterminate age, but new compared with 12/16/2019. Generalized osteopenia. No static listhesis. Mild degenerative disease with disc height loss of the thoracic spine. Visualized portions of the lungs are clear. IMPRESSION: 1. Age indeterminate, mild T8 vertebral body compression  fracture of indeterminate age, new compared with 12/16/2019. Electronically Signed   By: Kathreen Devoid M.D.   On: 09/21/2021 14:04   DG Hand Complete Left  Result Date: 09/21/2021 CLINICAL DATA:  Left hand pain status post fall EXAM: LEFT HAND - COMPLETE 3+ VIEW COMPARISON:  None Available. FINDINGS: No acute fracture or dislocation. No aggressive osseous lesion. Normal alignment. Generalized osteopenia. 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 Mild osteoarthritis of the D IP joints. Soft tissue are unremarkable. No radiopaque foreign body or soft tissue emphysema. IMPRESSION: 1. No acute osseous injury of the left hand. Electronically Signed   By: Kathreen Devoid M.D.   On: 09/21/2021 14:02   DG Wrist Complete Right  Result Date: 09/21/2021 CLINICAL DATA:  71 year old following fall down the stairs. EXAM: RIGHT WRIST - COMPLETE 3+ VIEW  COMPARISON:  Additional extremity radiographs of the same date. FINDINGS: Comminuted completely displaced and angulated fracture of the distal radius showing intra-articular extension. Dorsal displacement of the distal radius and carpal bones. Note that the pisiform maintains its position while remaining carpal bones are dorsally displaced along with the comminuted distal radius. Signs of distal ulnar fracture as well. There is also tilt of the radial articular surface in the radial-ulnar orientation with the radial surface remaining higher than the ulnar surface. No additional signs of fracture though nonstandard positioning related to the markedly displaced and angulated fracture does limit assessment. Overlying soft tissue swelling. IMPRESSION: 1. Comminuted, completely dorsally displaced and intra-articular fracture of the distal radius associated with dorsal tilt of the radial articular surface and also with tilt along the radial-ulnar axis as described. 2. Radiocarpal relationships are maintained with the exception of the pisiform which has maintained its usual position  following dorsal migration of the remaining carpal bones. Electronically Signed   By: Zetta Bills M.D.   On: 09/21/2021 14:02   DG Elbow Complete Right  Result Date: 09/21/2021 CLINICAL DATA:  Right elbow pain status post fall EXAM: RIGHT ELBOW - COMPLETE 3+ VIEW COMPARISON:  None Available. FINDINGS: No acute fracture or dislocation. No aggressive osseous lesion. Normal alignment. Soft tissue are unremarkable. No radiopaque foreign body or soft tissue emphysema. IMPRESSION: 1. No acute osseous injury of the right elbow. Electronically Signed   By: Kathreen Devoid M.D.   On: 09/21/2021 14:01   DG Hip Unilat W or Wo Pelvis 2-3 Views Right  Result Date: 09/21/2021 CLINICAL DATA:  Status post fall, right-sided pain. EXAM: DG HIP (WITH OR WITHOUT PELVIS) 2-3V RIGHT COMPARISON:  None Available. FINDINGS: No acute fracture or dislocation. No aggressive osseous lesion. Normal alignment. Generalized osteopenia. Soft tissue are unremarkable. No radiopaque foreign body or soft tissue emphysema. IMPRESSION: 1. No acute osseous injury of the right hip. Given the patient's age and osteopenia, if there is persistent clinical concern for an occult hip fracture, a MRI of the hip is recommended for increased sensitivity. Electronically Signed   By: Kathreen Devoid M.D.   On: 09/21/2021 14:00   DG Shoulder Right  Result Date: 09/21/2021 CLINICAL DATA:  Status post fall.  Right shoulder pain. EXAM: RIGHT SHOULDER - 2+ VIEW COMPARISON:  None Available. FINDINGS: No acute fracture or dislocation. No aggressive osseous lesion. Normal alignment. Generalized osteopenia. Soft tissue are unremarkable. No radiopaque foreign body or soft tissue emphysema. IMPRESSION: 1.  No acute osseous injury of the right shoulder. Electronically Signed   By: Kathreen Devoid M.D.   On: 09/21/2021 13:58    ROS no recent illnesses or hospitalizations  Blood pressure (!) 155/81, pulse 72, temperature 98.2 F (36.8 C), temperature source Oral, resp.  rate 18, height '5\' 9"'$  (1.753 m), weight 90.7 kg, SpO2 93 %. Physical Exam   General Appearance:  Alert, cooperative, no distress, appears stated age  Head:  Normocephalic, without obvious abnormality, atraumatic  Eyes:  Pupils equal, conjunctiva/corneas clear,         Throat: Lips, mucosa, and tongue normal; teeth and gums normal  Neck: No visible masses     Lungs:   respirations unlabored  Chest Wall:  No tenderness or deformity  Heart:  Regular rate and rhythm,  Abdomen:   Soft, non-tender,         Extremities: Patient is in a sugar-tong splint.  She is able to gently wiggle her fingers she is able to gently extend  her thumb good capillary refill good blood flow.  Pulses: 2+ and symmetric  Skin: Skin color, texture, turgor normal, no rashes or lesions     Neurologic: Normal     Assessment/Plan  Right wrist comminuted dorsal distal radius fracture dislocation and distal ulna fracture  Today the findings were reviewed with the patient the patient has a highly comminuted distal radius and distal ulna fracture.  We talked about stabilization with a bridge plating technique.  We talked about open reduction internal fixation.  We talked about the very challenging nature of this fracture given her long tobacco use history and likely osteo pia, osteoporosis.  Risks of surgery include but not limited to bleeding infection damage nearby nerves arteries or tendons nonunion malunion hardware failure loss of motion of the wrist and digits and need for further surgical invention. We talked about the second intervention to come back and take the bridge plating out.  We talked about additional supplementation with K wires or screws depending on the degree of comminution and fracture pattern.  R/B/A DISCUSSED WITH PT IN HOSPITAL.  PT VOICED UNDERSTANDING OF PLAN CONSENT SIGNED DAY OF SURGERY PT SEEN AND EXAMINED PRIOR TO OPERATIVE PROCEDURE/DAY OF SURGERY SITE MARKED. QUESTIONS ANSWERED WILL  GO HOME FOLLOWING SURGERY   WE ARE PLANNING SURGERY FOR YOUR UPPER EXTREMITY. THE RISKS AND BENEFITS OF SURGERY INCLUDE BUT NOT LIMITED TO BLEEDING INFECTION, DAMAGE TO NEARBY NERVES ARTERIES TENDONS, FAILURE OF SURGERY TO ACCOMPLISH ITS INTENDED GOALS, PERSISTENT SYMPTOMS AND NEED FOR FURTHER SURGICAL INTERVENTION. WITH THIS IN MIND WE WILL PROCEED. I HAVE DISCUSSED WITH THE PATIENT THE PRE AND POSTOPERATIVE REGIMEN AND THE DOS AND DON'TS. PT VOICED UNDERSTANDING AND INFORMED CONSENT SIGNED.   Linna Hoff 09/22/2021, 9:32 AM

## 2021-09-22 NOTE — Op Note (Signed)
PREOPERATIVE DIAGNOSIS: Right wrist highly comminuted intra-articular distal radius fracture 3 more fragments Right wrist highly comminuted distal ulna fracture Long-term tobacco use  POSTOPERATIVE DIAGNOSIS: Same  ATTENDING SURGEON: Dr. Iran Planas who scrubbed and present for the entire procedure  ASSISTANT SURGEON: None  ANESTHESIA: Regional with IV sedation  OPERATIVE PROCEDURE: Open treatment of right wrist intra-articular distal radius fracture 3 more fragments with internal fixation and spanning bridge plate. Right wrist posterior interosseous nerve neurectomy Right wrist EPL tendon transfer dorsal aspect of the hand Radiographs 3 views right wrist Right hand carpal tunnel release  IMPLANTS: [employer] spanning bridge plate  EBL: Minimal  RADIOGRAPHIC INTERPRETATION: AP lateral oblique views of the wrist do show the dorsal spanning bridge plate with relatively good position of the distal radius radial height length and tilt with a highly comminuted distal ulna fracture  SURGICAL INDICATIONS: Patient is a right-hand-dominant female who sustained a closed injury fracture dislocation of the right distal radius.  Patient was seen and evaluated and recommended undergo the above procedure.  The risks of surgery include but not limited to bleeding infection damage nearby nerves arteries or tendons nonunion malunion hardware failure loss of motion of the wrist and digits incomplete relief of symptoms and need for further surgical invention.  Signed informed consent obtained the day of surgery.  SURGICAL TECHNIQUE: The patient was prepped identified in the preoperative holding area marked for marker made on the right wrist indicate correct operative site.  Patient brought back to operating room placed supine on the anesthesia table where the regional anesthetic had been administered.  Preoperative antibiotics were given prior to skin incision.  A well-padded tourniquet placed on the  right brachium and stay with the appropriate drape.  The right upper extremities then prepped and draped normal sterile fashion.  A timeout was called the correct site identified procedure then begun.  A longitudinal incision made directly over the fourth dorsal compartment.  Dissection carried down through the skin and subcutaneous tissue.  The EPL tendon was then carefully identified and carefully retracted and transfer the dorsal aspect of the hand.  Going through the floor the fourth dorsal compartment the posterior interosseous nerve was then carefully identified and then transected.  The fourth dorsal compartment tendons were then carefully mobilized exposing the highly comminuted intra-articular fracture.  Following this a longitudinal incision made further distally along the long finger metacarpal.  The bridge plate was then applied to the long finger metacarpal and slid proximally.  Careful protection of the EPL as well as the crossing first dorsal compartment tendons were then carefully done.  Once this was done the plate was then positioned and confirmed using the mini C arm.  K wires held up proximally and distally.  Distal fixation was carried out with a combination of locking nonlocking bicortical screws.  Following this K wire fixation was carried out proximally and the oblong screw hole was then placed in the plate was then placed along the dorsal aspect of the radial shaft with good position.  Following this the oblong screw hole was then adjusted to bring the wrist out into let out the length.  The patient did have the highly comminuted intra-articular fracture of 3 more fragments.  The ulnar column was then carefully elevated.  4 cc of Biomet bone graft substitute was then packed behind this defect keeping the ulnar column out to length.  After packing of the bone graft substitute along the ulnar column and the remaining portion of the  bone graft substitute was then packed along the radial column.   The wound was then thoroughly irrigated.  After this was carried out final shaft fixation was then carried out proximally stabilizing the construct.  The wound was then thoroughly irrigated.  The fourth dorsal compartment retinaculum was then closed with 2-0 Vicryl suture.  The subcutaneous tissue was then closed with Monocryl and the skin closed with Prolene sutures.  Attention was then turned to the volar aspect of the wrist.  Through a separate incision longitudinal incision made directly in the mid palm.  This was curved in an ulnar fashion across the wrist crease and extended proximally.  Dissection carried out through the skin and subcutaneous tissue.  The palmar fascia was incised longitudinally.  The median nerve was then carefully identified after release of the transverse carpal ligament.  The nerve was then released under direct visualization proximally and distally.  Proximally the nerve was then and the flexor tendons were then carefully retracted out of the way and the fragments that were within the carpal canal or carpal tunnel region were then removed.  The wound was then thoroughly irrigated.  After thorough wound irrigation final radiographs were then obtained.  The carpal tunnel incision was then closed using simple Prolene in horizontal mattress sutures.  Xeroform dressing was then applied.  Adaptic dressing applied dorsally.  A sterile compressive bandage then applied.  The patient placed in a well-padded sugar-tong splint taken recovery in good condition.  POSTOPERATIVE PLAN: Patient be discharged to home.  See him back in the office in 10 to 14 days for wound check x-rays application of a short arm cast.  Follow the patient very closely.  X-rays at each visit.  See her back at the 2-week mark for week mark 6-week mark 8-week mark and likely take the plate out around the 10-week mark.  Very guarded prognosis given the distal ulna.  The distal ulna was not amenable to internal fixation of the  highly comminuted head splitting fracture.  We will see how she does in terms of the rotation to very concerned about the rotation of the forearm.

## 2021-09-22 NOTE — Transfer of Care (Signed)
Immediate Anesthesia Transfer of Care Note  Patient: Alexis Hart  Procedure(s) Performed: OPEN REDUCTION INTERNAL FIXATION (ORIF) DISTAL RADIAL FRACTURE (Right: Wrist) CARPAL TUNNEL RELEASE (Right: Wrist)  Patient Location: PACU  Anesthesia Type:MAC and Regional  Level of Consciousness: awake, patient cooperative and responds to stimulation  Airway & Oxygen Therapy: Patient Spontanous Breathing  Post-op Assessment: Report given to RN and Post -op Vital signs reviewed and stable  Post vital signs: Reviewed and stable  Last Vitals:  Vitals Value Taken Time  BP 149/65 09/22/21 1230  Temp    Pulse 87 09/22/21 1230  Resp 24 09/22/21 1230  SpO2 87 % 09/22/21 1230  Vitals shown include unvalidated device data.  Last Pain:  Vitals:   09/22/21 0811  TempSrc:   PainSc: 8       Patients Stated Pain Goal: 3 (59/47/07 6151)  Complications: No notable events documented.

## 2021-09-22 NOTE — Anesthesia Procedure Notes (Addendum)
Anesthesia Regional Block: Supraclavicular block   Pre-Anesthetic Checklist: , timeout performed,  Correct Patient, Correct Site, Correct Laterality,  Correct Procedure, Correct Position, site marked,  Risks and benefits discussed,  Surgical consent,  Pre-op evaluation,  At surgeon's request and post-op pain management  Laterality: Right and Upper  Prep: chloraprep       Needles:  Injection technique: Single-shot      Needle Length: 5cm  Needle Gauge: 22     Additional Needles: Arrow StimuQuik ECHO Echogenic Stimulating PNB Needle  Procedures:,,,, ultrasound used (permanent image in chart),,    Narrative:  Start time: 09/22/2021 9:09 AM End time: 09/22/2021 9:16 AM Injection made incrementally with aspirations every 5 mL.  Performed by: Personally  Anesthesiologist: Oleta Mouse, MD

## 2021-09-22 NOTE — Discharge Instructions (Signed)
KEEP BANDAGE CLEAN AND DRY CALL OFFICE FOR F/U APPT [phone] IN 12 DAYS RX SENT TO [facility] KEEP HAND ELEVATED ABOVE HEART OK TO APPLY ICE TO OPERATIVE AREA CONTACT OFFICE IF ANY WORSENING PAIN OR CONCERNS.

## 2021-09-22 NOTE — Anesthesia Preprocedure Evaluation (Signed)
Anesthesia Evaluation  Patient identified by MRN, date of birth, ID band Patient awake    Reviewed: Allergy & Precautions, NPO status , Patient's Chart, lab work & pertinent test results  History of Anesthesia Complications Negative for: history of anesthetic complications  Airway Mallampati: III  TM Distance: >3 FB Neck ROM: Full    Dental  (+) Teeth Intact, Dental Advisory Given   Pulmonary COPD, former smoker,    breath sounds clear to auscultation       Cardiovascular + CAD and + Peripheral Vascular Disease   Rhythm:Regular  Aortic atherosclerosis. Ascending aorta measures maximally 3.7 cm on transverse image 26/2 and 3.8 cm on coronal image 118. Normal heart size, without pericardial effusion. Multivessel coronary artery atherosclerosis.   Neuro/Psych PSYCHIATRIC DISORDERS Anxiety    GI/Hepatic Neg liver ROS, GERD  ,  Endo/Other  negative endocrine ROS  Renal/GU negative Renal ROS     Musculoskeletal RIGHT DISTAL RADIUS FRACTURE   Abdominal   Peds  Hematology negative hematology ROS (+)   Anesthesia Other Findings   Reproductive/Obstetrics                             Anesthesia Physical Anesthesia Plan  ASA: 2  Anesthesia Plan: MAC and Regional   Post-op Pain Management: Regional block*   Induction: Intravenous  PONV Risk Score and Plan: 2 and Propofol infusion and Treatment may vary due to age or medical condition  Airway Management Planned: Nasal Cannula, Simple Face Mask and Natural Airway  Additional Equipment: None  Intra-op Plan:   Post-operative Plan:   Informed Consent: I have reviewed the patients History and Physical, chart, labs and discussed the procedure including the risks, benefits and alternatives for the proposed anesthesia with the patient or authorized representative who has indicated his/her understanding and acceptance.     Dental advisory  given  Plan Discussed with: CRNA  Anesthesia Plan Comments:         Anesthesia Quick Evaluation

## 2021-09-24 ENCOUNTER — Encounter (HOSPITAL_COMMUNITY): Payer: Self-pay | Admitting: Orthopedic Surgery

## 2021-09-25 ENCOUNTER — Encounter (HOSPITAL_COMMUNITY): Payer: Self-pay | Admitting: Orthopedic Surgery

## 2021-09-25 NOTE — Anesthesia Postprocedure Evaluation (Signed)
Anesthesia Post Note  Patient: NASIYA PASCUAL  Procedure(s) Performed: OPEN REDUCTION INTERNAL FIXATION (ORIF) DISTAL RADIAL FRACTURE (Right: Wrist) CARPAL TUNNEL RELEASE (Right: Wrist)     Patient location during evaluation: PACU Anesthesia Type: Regional and MAC Level of consciousness: awake and alert Pain management: pain level controlled Vital Signs Assessment: post-procedure vital signs reviewed and stable Respiratory status: spontaneous breathing, nonlabored ventilation and respiratory function stable Cardiovascular status: stable and blood pressure returned to baseline Postop Assessment: no apparent nausea or vomiting Anesthetic complications: no   No notable events documented.  Last Vitals:  Vitals:   09/22/21 1230 09/22/21 1245  BP: (!) 149/65 (!) 172/70  Pulse: 91 90  Resp: 20 (!) 24  Temp: 36.7 C 36.9 C  SpO2: 91% 93%    Last Pain:  Vitals:   09/22/21 1245  TempSrc:   PainSc: 0-No pain                 Shanee Batch

## 2021-09-28 DIAGNOSIS — F330 Major depressive disorder, recurrent, mild: Secondary | ICD-10-CM | POA: Diagnosis not present

## 2021-09-28 DIAGNOSIS — G620 Drug-induced polyneuropathy: Secondary | ICD-10-CM | POA: Diagnosis not present

## 2021-09-28 DIAGNOSIS — G8929 Other chronic pain: Secondary | ICD-10-CM | POA: Diagnosis not present

## 2021-09-28 DIAGNOSIS — Z8781 Personal history of (healed) traumatic fracture: Secondary | ICD-10-CM | POA: Diagnosis not present

## 2021-09-28 DIAGNOSIS — E663 Overweight: Secondary | ICD-10-CM | POA: Diagnosis not present

## 2021-09-28 DIAGNOSIS — Z87891 Personal history of nicotine dependence: Secondary | ICD-10-CM | POA: Diagnosis not present

## 2021-10-05 DIAGNOSIS — S52501A Unspecified fracture of the lower end of right radius, initial encounter for closed fracture: Secondary | ICD-10-CM | POA: Diagnosis not present

## 2021-10-23 DIAGNOSIS — S52501A Unspecified fracture of the lower end of right radius, initial encounter for closed fracture: Secondary | ICD-10-CM | POA: Diagnosis not present

## 2021-11-06 DIAGNOSIS — S52501A Unspecified fracture of the lower end of right radius, initial encounter for closed fracture: Secondary | ICD-10-CM | POA: Diagnosis not present

## 2021-11-20 DIAGNOSIS — S52501A Unspecified fracture of the lower end of right radius, initial encounter for closed fracture: Secondary | ICD-10-CM | POA: Diagnosis not present

## 2021-11-28 DIAGNOSIS — S52501D Unspecified fracture of the lower end of right radius, subsequent encounter for closed fracture with routine healing: Secondary | ICD-10-CM | POA: Diagnosis not present

## 2021-11-28 DIAGNOSIS — Z472 Encounter for removal of internal fixation device: Secondary | ICD-10-CM | POA: Diagnosis not present

## 2021-12-11 DIAGNOSIS — M25641 Stiffness of right hand, not elsewhere classified: Secondary | ICD-10-CM | POA: Diagnosis not present

## 2021-12-11 DIAGNOSIS — S52501D Unspecified fracture of the lower end of right radius, subsequent encounter for closed fracture with routine healing: Secondary | ICD-10-CM | POA: Diagnosis not present

## 2021-12-25 DIAGNOSIS — M25631 Stiffness of right wrist, not elsewhere classified: Secondary | ICD-10-CM | POA: Diagnosis not present

## 2021-12-25 DIAGNOSIS — M25641 Stiffness of right hand, not elsewhere classified: Secondary | ICD-10-CM | POA: Diagnosis not present

## 2022-01-01 DIAGNOSIS — M25641 Stiffness of right hand, not elsewhere classified: Secondary | ICD-10-CM | POA: Diagnosis not present

## 2022-01-01 DIAGNOSIS — M25631 Stiffness of right wrist, not elsewhere classified: Secondary | ICD-10-CM | POA: Diagnosis not present

## 2022-01-08 DIAGNOSIS — S52501D Unspecified fracture of the lower end of right radius, subsequent encounter for closed fracture with routine healing: Secondary | ICD-10-CM | POA: Diagnosis not present

## 2022-01-08 DIAGNOSIS — Z4789 Encounter for other orthopedic aftercare: Secondary | ICD-10-CM | POA: Diagnosis not present

## 2022-01-08 DIAGNOSIS — M25641 Stiffness of right hand, not elsewhere classified: Secondary | ICD-10-CM | POA: Diagnosis not present

## 2022-01-08 DIAGNOSIS — M25631 Stiffness of right wrist, not elsewhere classified: Secondary | ICD-10-CM | POA: Diagnosis not present

## 2022-01-17 DIAGNOSIS — M25631 Stiffness of right wrist, not elsewhere classified: Secondary | ICD-10-CM | POA: Diagnosis not present

## 2022-01-17 DIAGNOSIS — M25641 Stiffness of right hand, not elsewhere classified: Secondary | ICD-10-CM | POA: Diagnosis not present

## 2022-01-29 DIAGNOSIS — M25641 Stiffness of right hand, not elsewhere classified: Secondary | ICD-10-CM | POA: Diagnosis not present

## 2022-01-29 DIAGNOSIS — M25631 Stiffness of right wrist, not elsewhere classified: Secondary | ICD-10-CM | POA: Diagnosis not present

## 2022-02-05 DIAGNOSIS — S52501D Unspecified fracture of the lower end of right radius, subsequent encounter for closed fracture with routine healing: Secondary | ICD-10-CM | POA: Diagnosis not present

## 2022-02-05 DIAGNOSIS — Z4789 Encounter for other orthopedic aftercare: Secondary | ICD-10-CM | POA: Diagnosis not present

## 2022-02-12 DIAGNOSIS — M25631 Stiffness of right wrist, not elsewhere classified: Secondary | ICD-10-CM | POA: Diagnosis not present

## 2022-02-12 DIAGNOSIS — M25641 Stiffness of right hand, not elsewhere classified: Secondary | ICD-10-CM | POA: Diagnosis not present

## 2022-02-13 DIAGNOSIS — M810 Age-related osteoporosis without current pathological fracture: Secondary | ICD-10-CM | POA: Diagnosis not present

## 2022-03-19 DIAGNOSIS — Z4789 Encounter for other orthopedic aftercare: Secondary | ICD-10-CM | POA: Diagnosis not present

## 2022-03-19 DIAGNOSIS — S52501D Unspecified fracture of the lower end of right radius, subsequent encounter for closed fracture with routine healing: Secondary | ICD-10-CM | POA: Diagnosis not present

## 2022-07-28 DIAGNOSIS — J449 Chronic obstructive pulmonary disease, unspecified: Secondary | ICD-10-CM | POA: Diagnosis not present

## 2022-07-28 DIAGNOSIS — G4700 Insomnia, unspecified: Secondary | ICD-10-CM | POA: Diagnosis not present

## 2022-07-28 DIAGNOSIS — E663 Overweight: Secondary | ICD-10-CM | POA: Diagnosis not present

## 2022-07-28 DIAGNOSIS — M810 Age-related osteoporosis without current pathological fracture: Secondary | ICD-10-CM | POA: Diagnosis not present

## 2022-07-28 DIAGNOSIS — Z87891 Personal history of nicotine dependence: Secondary | ICD-10-CM | POA: Diagnosis not present

## 2022-11-06 ENCOUNTER — Telehealth: Payer: Self-pay

## 2022-11-06 NOTE — Telephone Encounter (Signed)
LVM for patient to call back [phone], or to call PCP office to schedule follow up apt. AS, CMA  

## 2023-11-07 IMAGING — CR DG ELBOW COMPLETE 3+V*R*
4 series · 5 of 5 positions shown · non-contrast
Comparison: None Available.

CLINICAL DATA: Right elbow pain status post fall

EXAM:
RIGHT ELBOW - COMPLETE 3+ VIEW

[elbow ap]
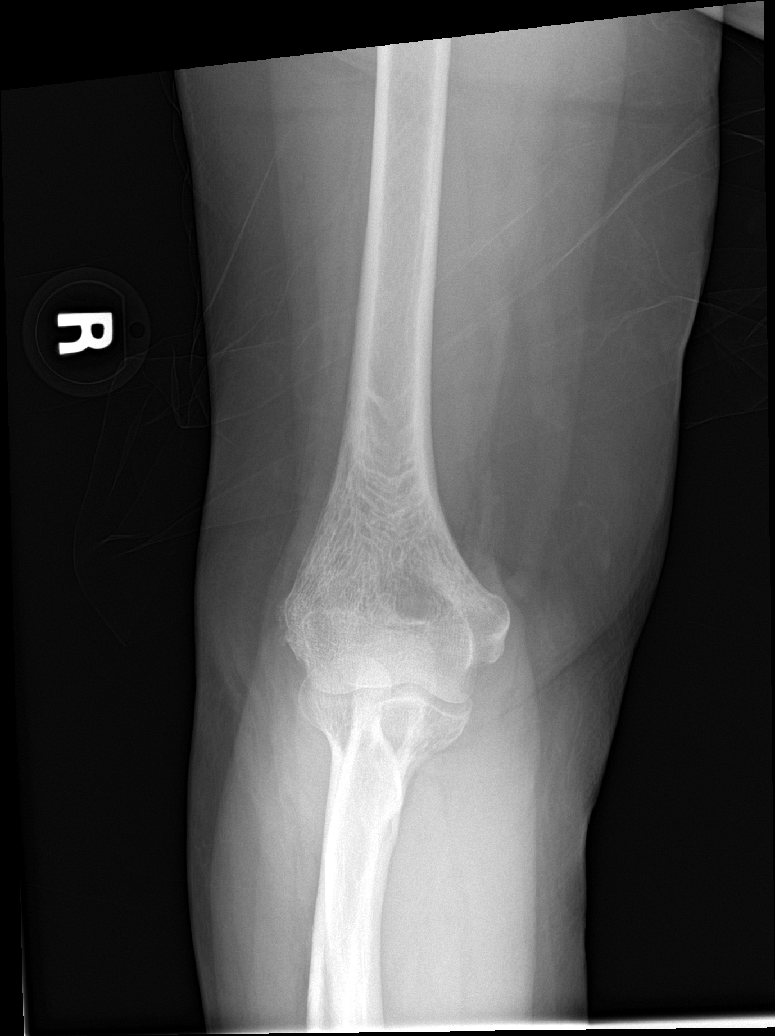

[elbow obl (1 of 2)]
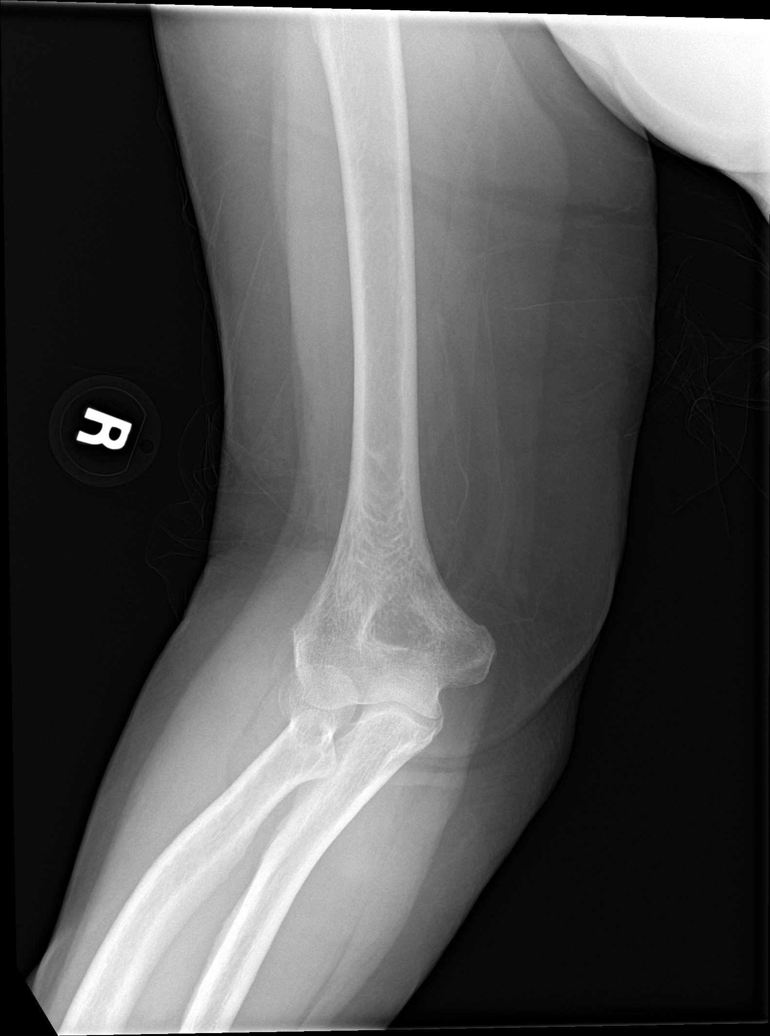

[Series 3: elbow obl · 0.14mm/px · 2 of 2 slices shown (2 of 2)]
[im 1/2]
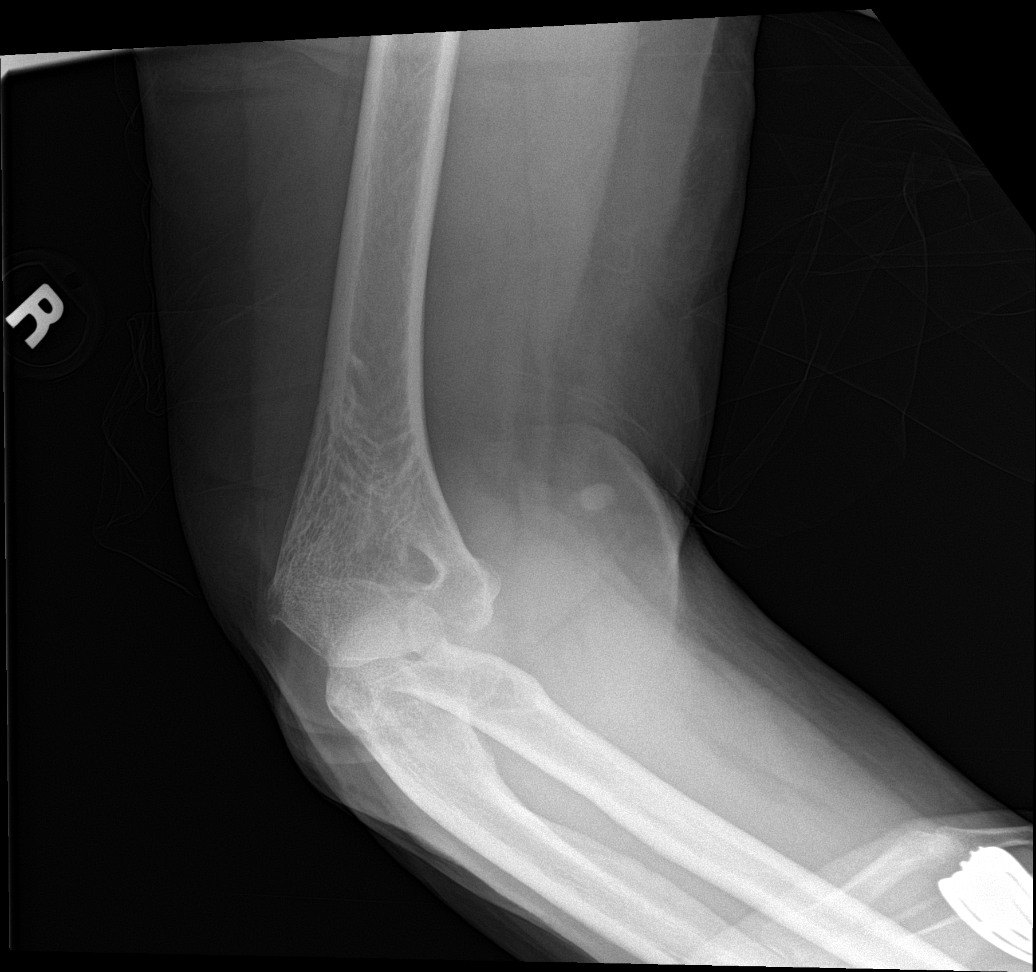
[im 2/2]
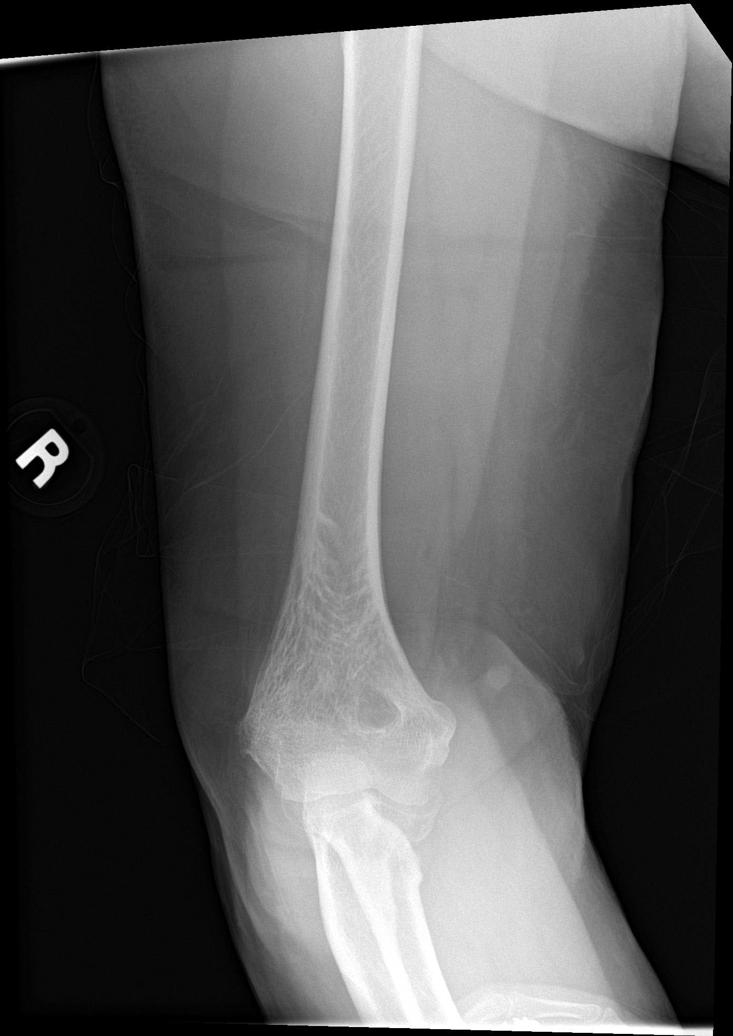

[elbow lat]
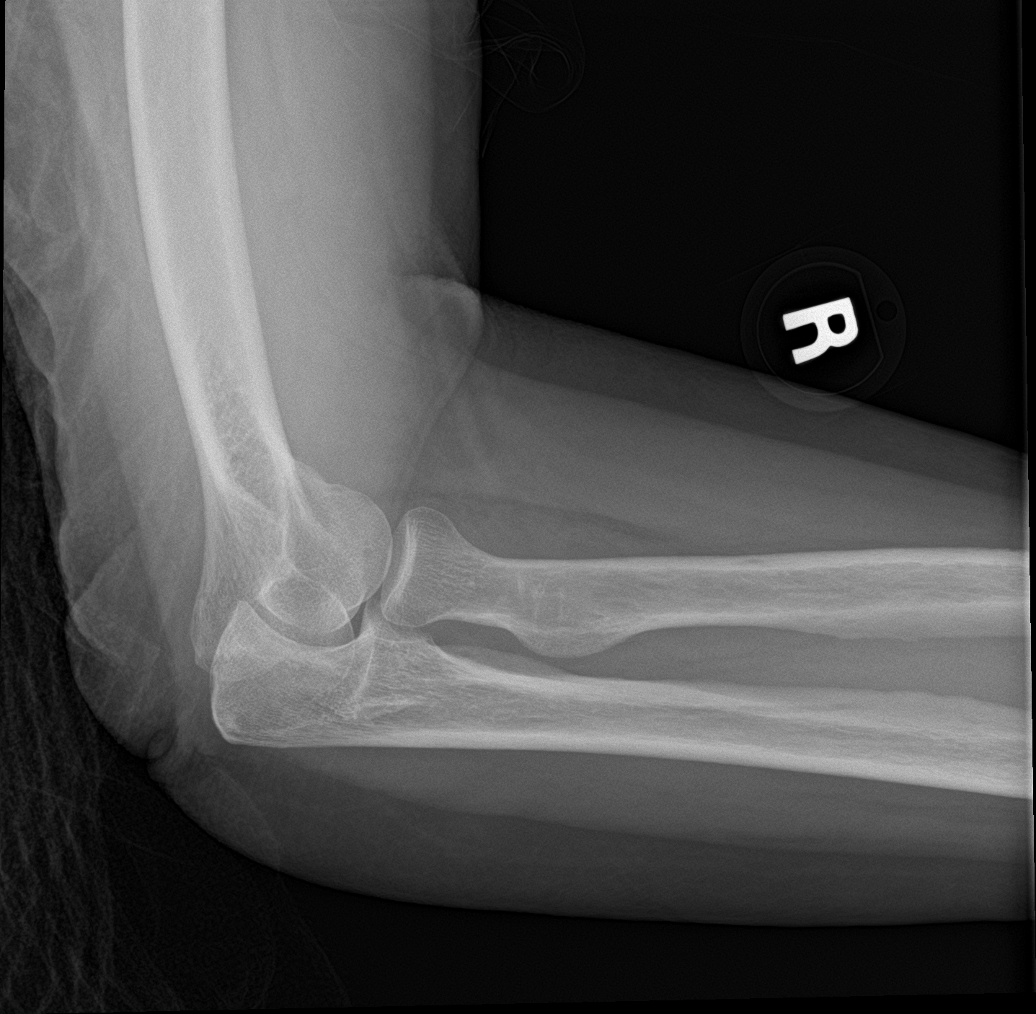

[5 of 5 positions shown; findings below may reference images not displayed]

FINDINGS: No acute fracture or dislocation. No aggressive osseous lesion.
Normal alignment.

Soft tissue are unremarkable. No radiopaque foreign body or soft
tissue emphysema.
IMPRESSION: 1. No acute osseous injury of the right elbow.

## 2023-11-07 IMAGING — DX DG WRIST COMPLETE 3+V*R*
2 series · 3 of 3 positions shown · non-contrast
Comparison: Same day radiographs

CLINICAL DATA: Right wrist fracture status post reduction

EXAM:
RIGHT WRIST - COMPLETE 3+ VIEW

[Series 1: wrist ap · 0.14mm/px · 2 of 2 slices shown]
[im 1/2]
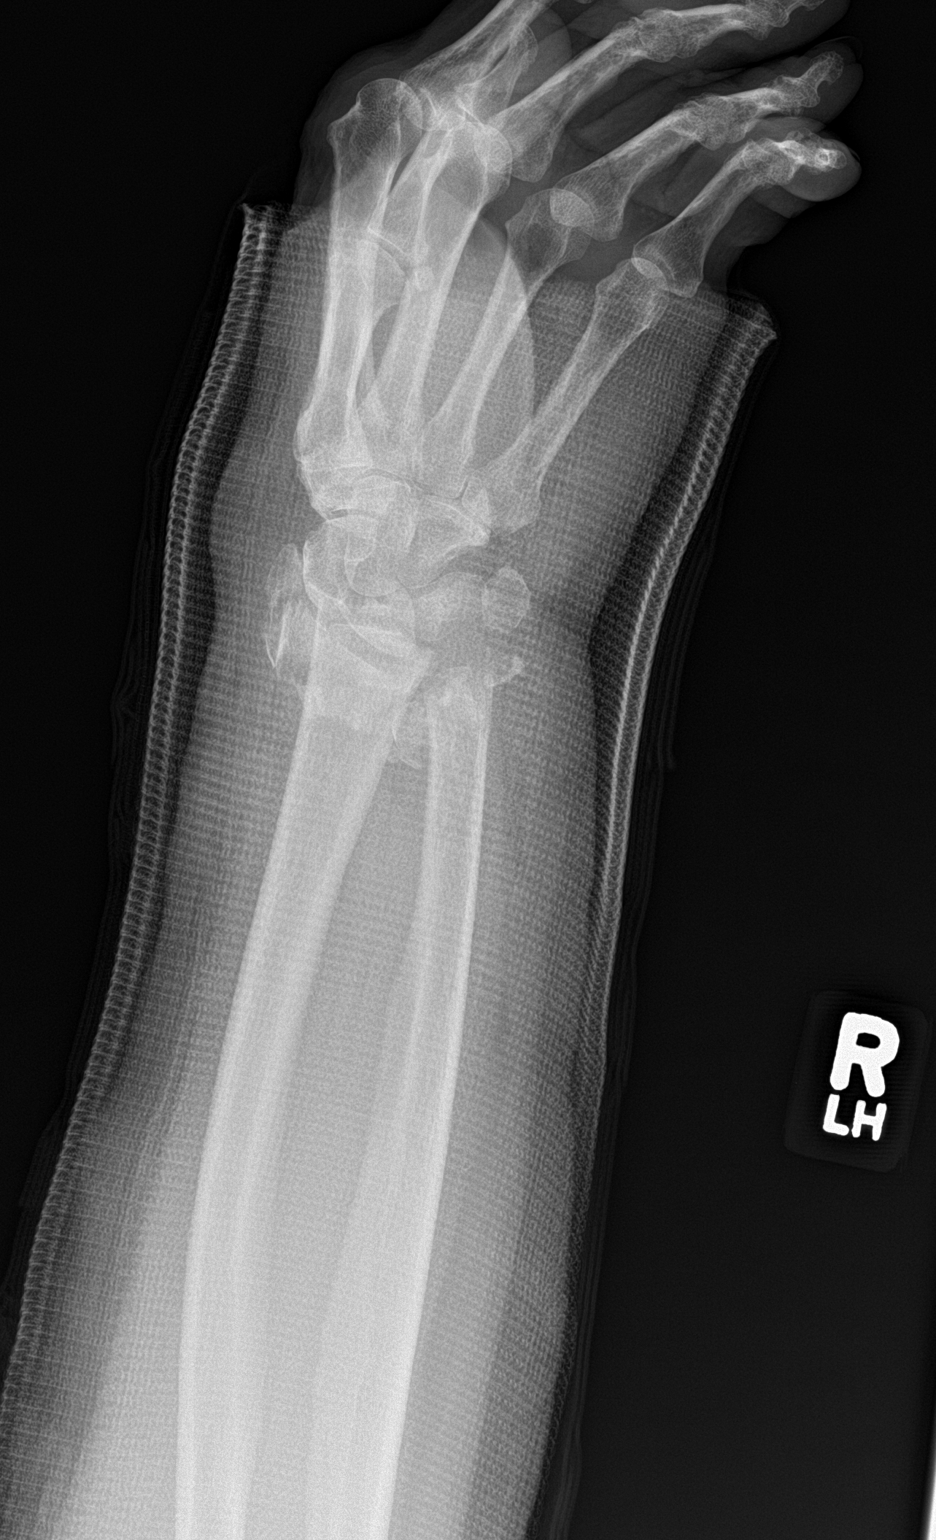
[im 2/2]
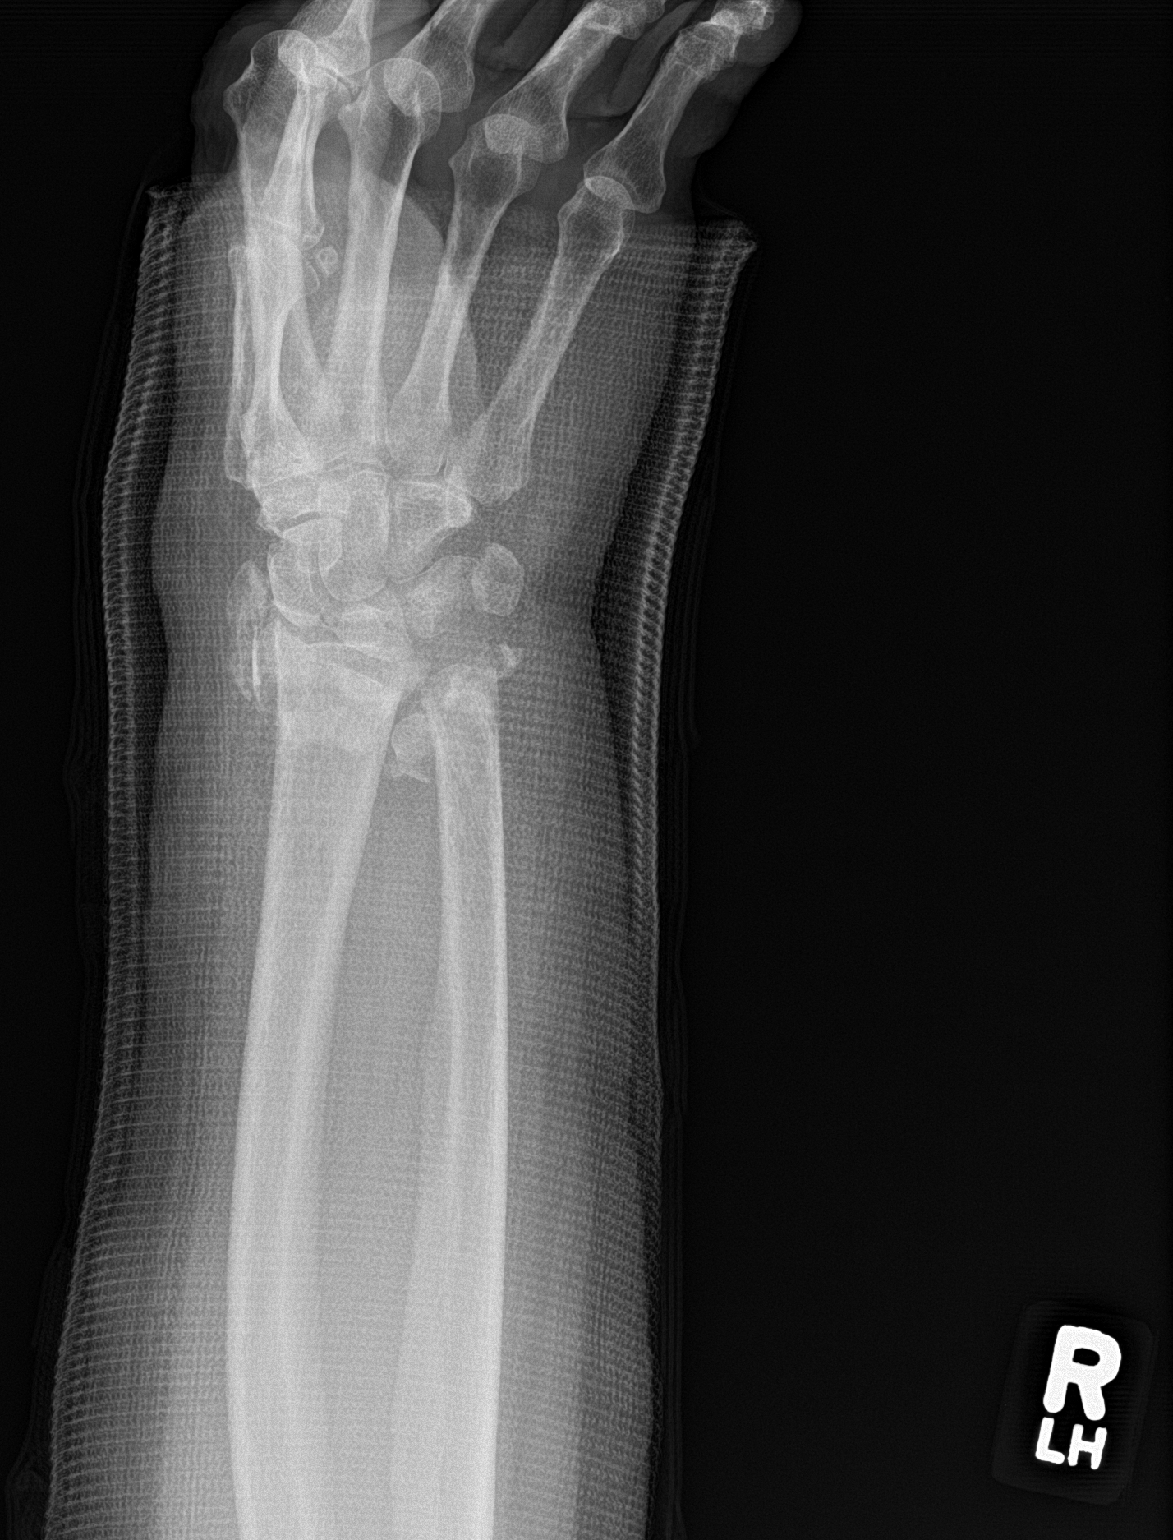

[wrist lat]
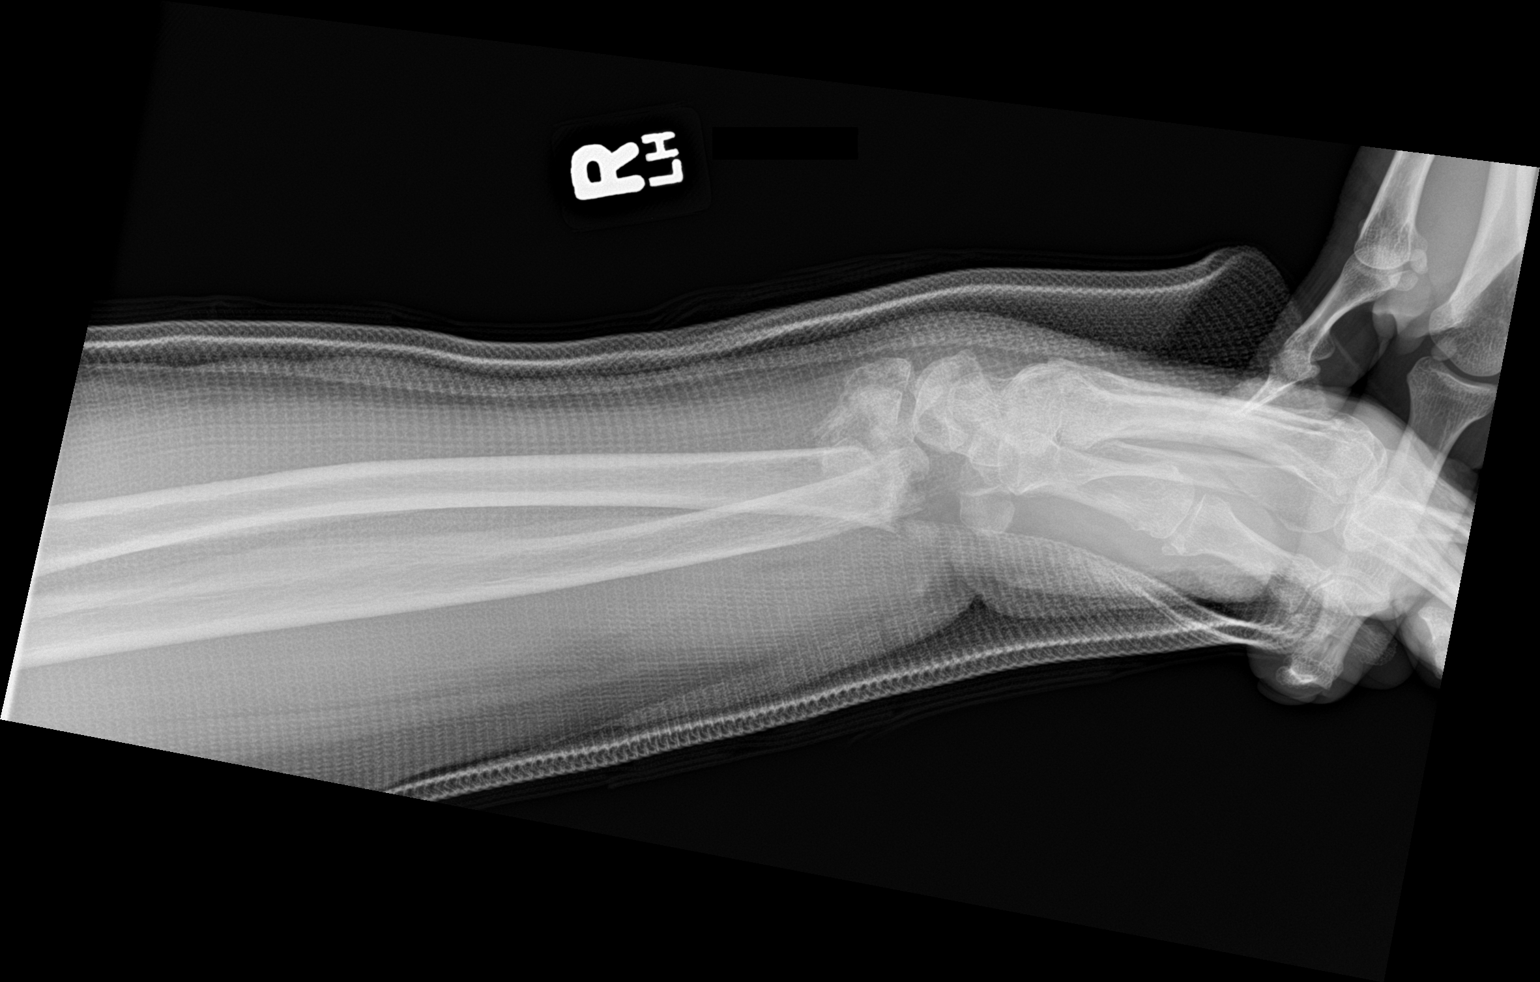

[3 of 3 positions shown; findings below may reference images not displayed]

FINDINGS: Interval reduction and splinting of distal radial and ulnar
fractures. Slight interval improvement in the fracture alignment,
however distal radial fracture remains dorsally displaced by
approximately 1 shaft width. Radiocarpal joint alignment appears
maintained.
IMPRESSION: Interval reduction and splinting of distal radial and ulnar
fractures with persistent dorsal displacement of the distal radial
fracture.

## 2023-11-07 IMAGING — CR DG THORACIC SPINE 2V
3 series · 3 of 3 positions shown · non-contrast
Comparison: 12/16/2019 CT chest

CLINICAL DATA: Status post fall, back pain

EXAM:
THORACIC SPINE 2 VIEWS

[t-spine ap]
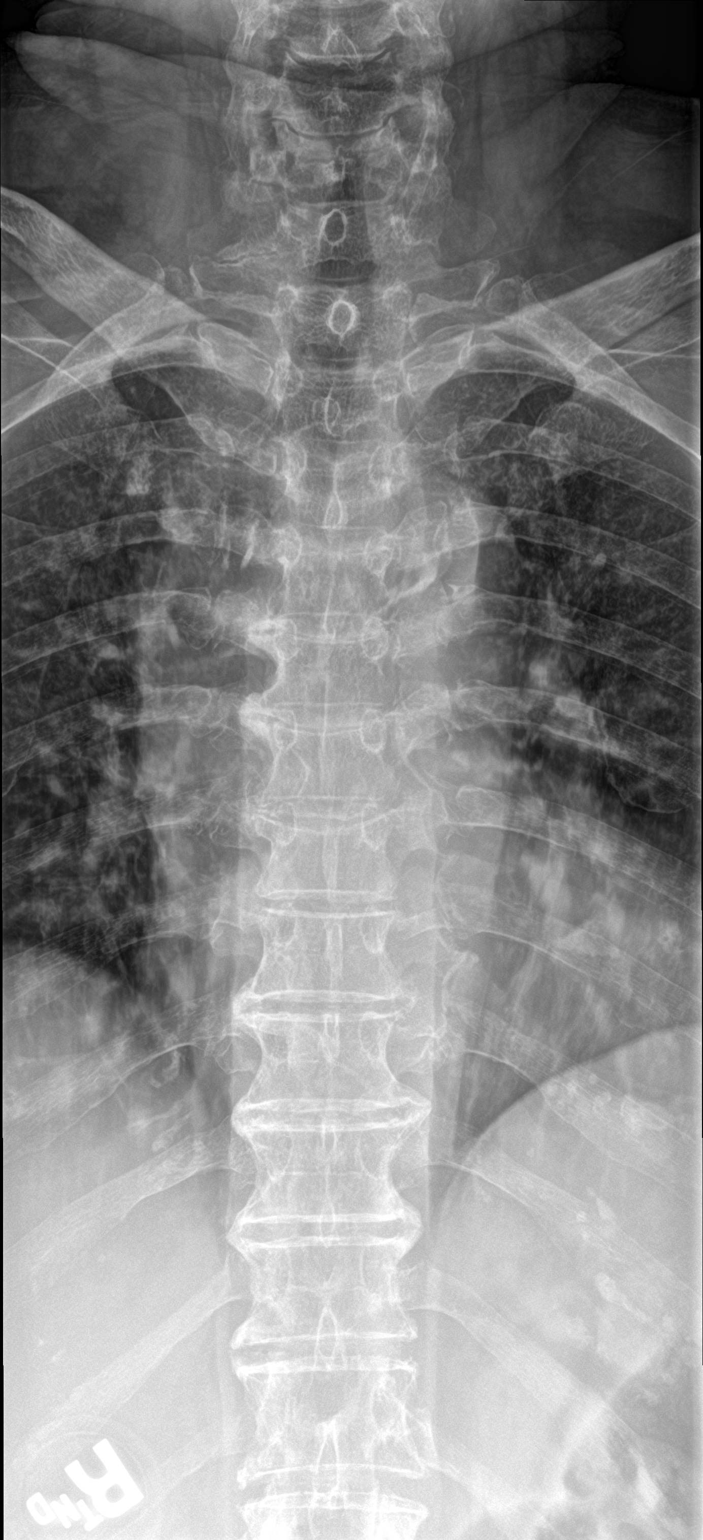

[t-spine lat]
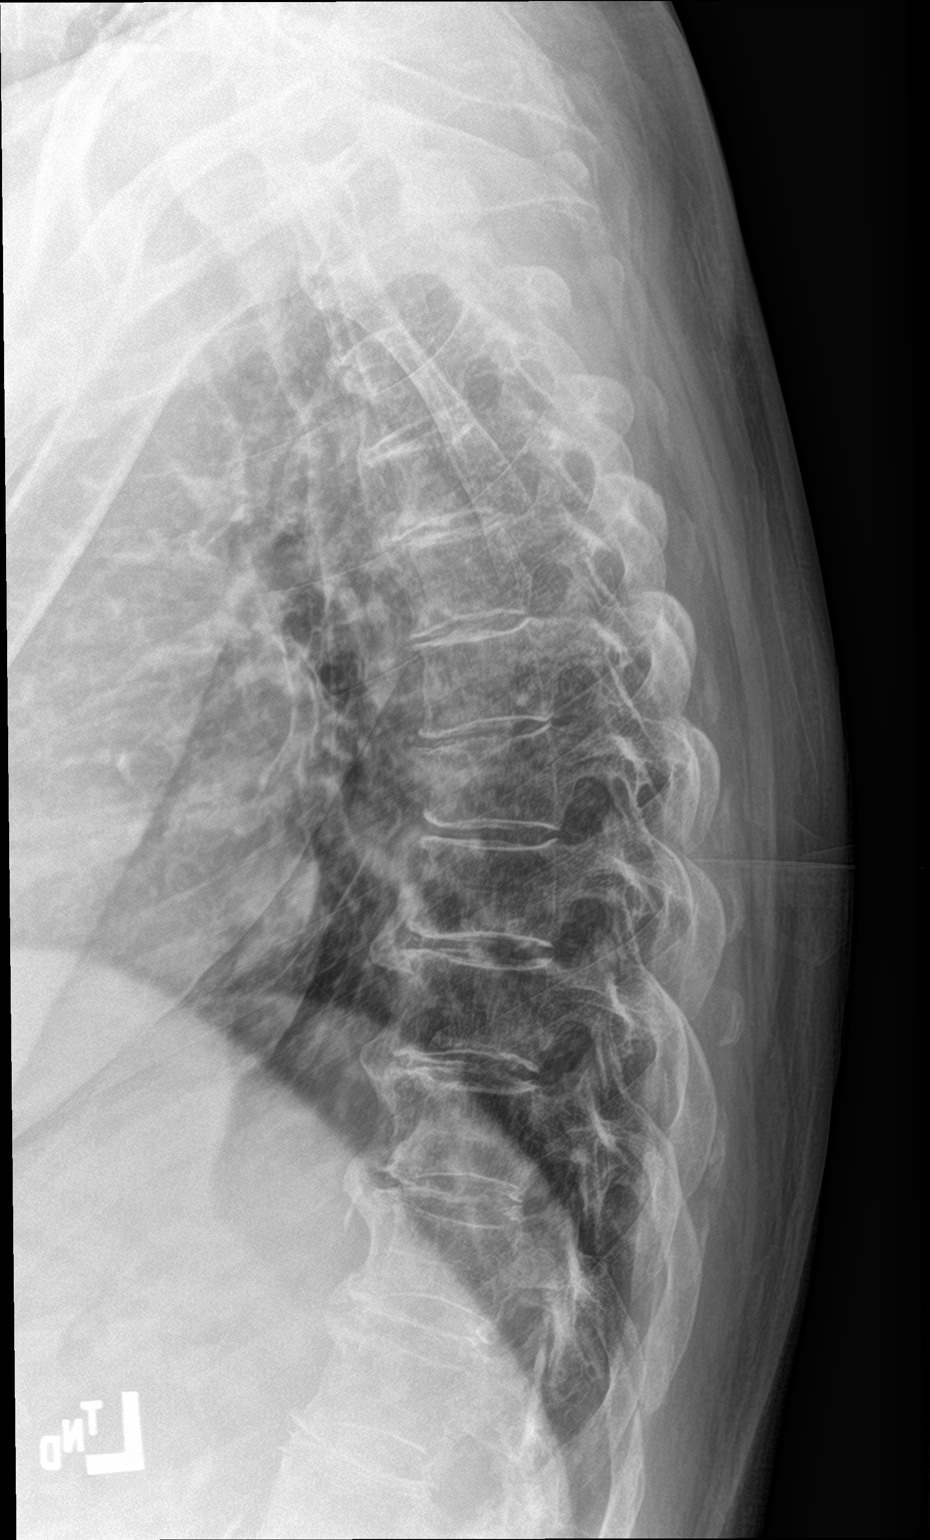

[t-spine swimmers]
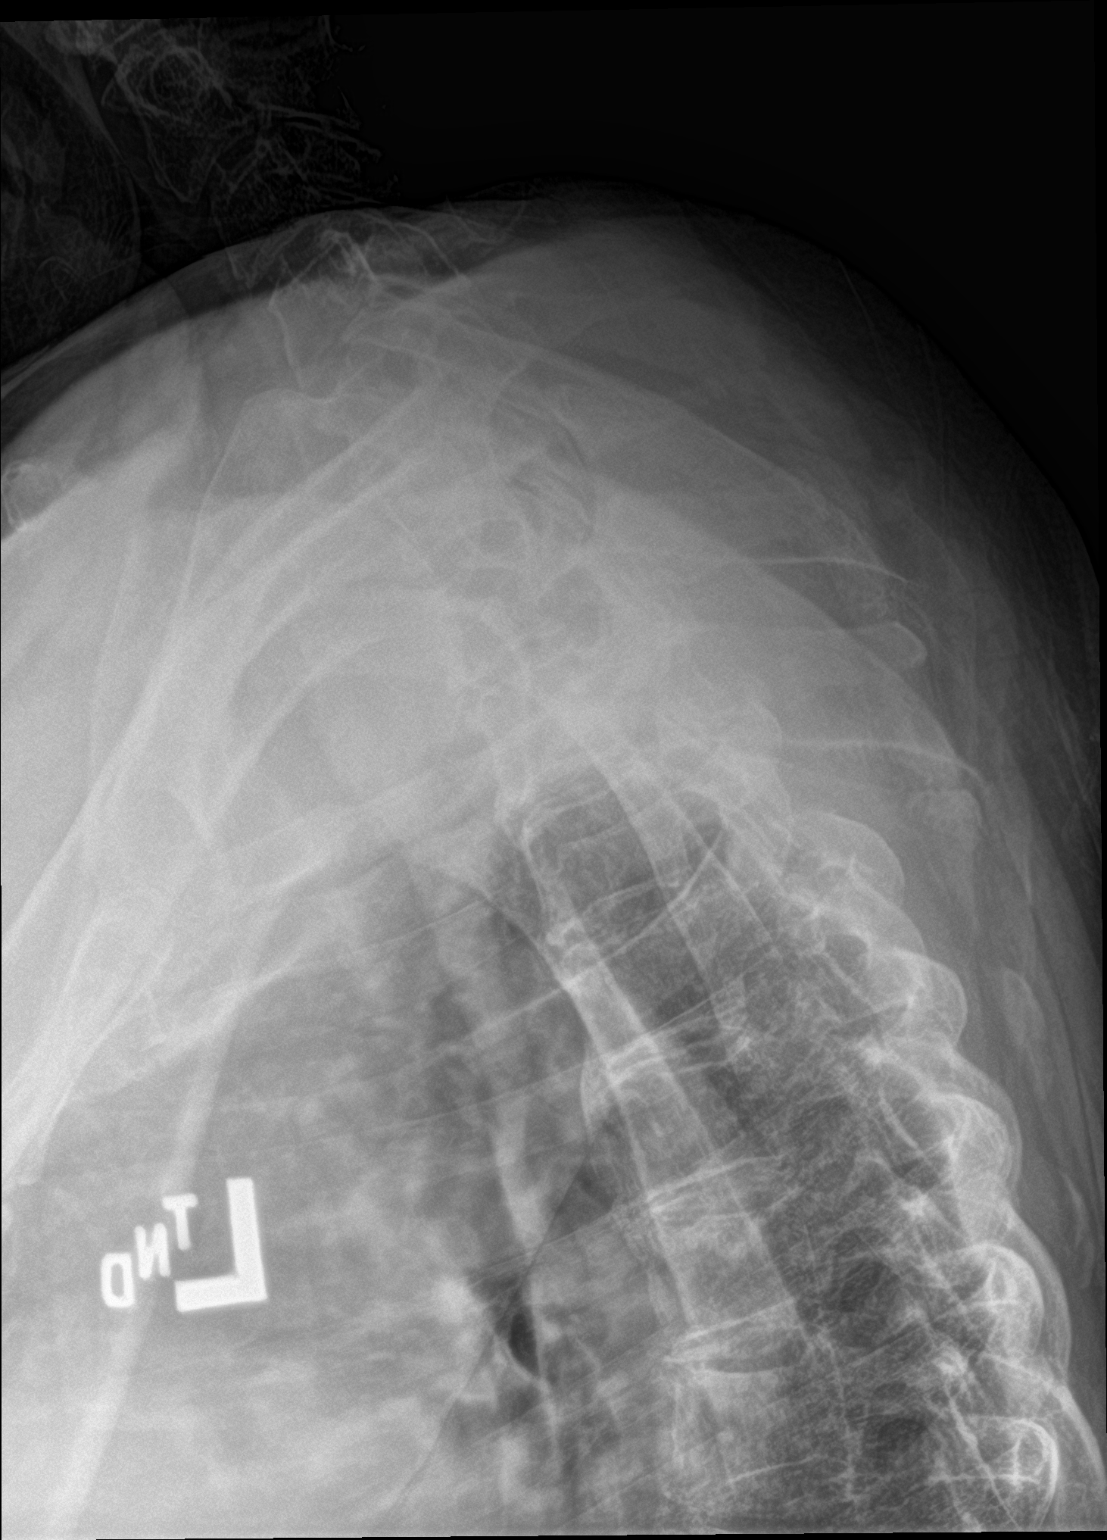

[3 of 3 positions shown; findings below may reference images not displayed]

FINDINGS: Mild T8 vertebral body compression fracture of indeterminate age,
but new compared with 12/16/2019. Generalized osteopenia. No static
listhesis.

Mild degenerative disease with disc height loss of the thoracic
spine.

Visualized portions of the lungs are clear.
IMPRESSION: 1. Age indeterminate, mild T8 vertebral body compression fracture of
indeterminate age, new compared with 12/16/2019.
